# Patient Record
Sex: Male | Born: 1990 | Race: White | Hispanic: No | Marital: Married | Smoking: Never smoker
Health system: Southern US, Community
[De-identification: ages and names within clinical notes are randomized; demographics above are authoritative.]

## PROBLEM LIST (undated history)

## (undated) ENCOUNTER — Ambulatory Visit (HOSPITAL_COMMUNITY): Admission: EM | Discharge status: Absent without leave | Payer: BLUE CROSS/BLUE SHIELD

## (undated) DIAGNOSIS — D682 Hereditary deficiency of other clotting factors: Secondary | ICD-10-CM

## (undated) DIAGNOSIS — Q600 Renal agenesis, unilateral: Secondary | ICD-10-CM

## (undated) DIAGNOSIS — Z86718 Personal history of other venous thrombosis and embolism: Secondary | ICD-10-CM

## (undated) DIAGNOSIS — T7840XA Allergy, unspecified, initial encounter: Secondary | ICD-10-CM

## (undated) DIAGNOSIS — D689 Coagulation defect, unspecified: Secondary | ICD-10-CM

## (undated) HISTORY — DX: Renal agenesis, unilateral: Q60.0

## (undated) HISTORY — DX: Hereditary deficiency of other clotting factors: D68.2

## (undated) HISTORY — PX: LEG SURGERY: SHX1003

## (undated) HISTORY — PX: EYE SURGERY: SHX253

## (undated) HISTORY — DX: Allergy, unspecified, initial encounter: T78.40XA

## (undated) HISTORY — DX: Coagulation defect, unspecified: D68.9

---
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
## 2013-06-19 ENCOUNTER — Encounter (HOSPITAL_COMMUNITY): Payer: Self-pay | Admitting: Emergency Medicine

## 2013-06-19 ENCOUNTER — Emergency Department (HOSPITAL_COMMUNITY)
Admission: EM | Admit: 2013-06-19 | Discharge: 2013-06-19 | Disposition: A | Discharge status: Home / Self Care | Payer: BC Managed Care – PPO | Source: Home / Self Care

## 2013-06-19 DIAGNOSIS — J029 Acute pharyngitis, unspecified: Secondary | ICD-10-CM

## 2013-06-19 LAB — POCT RAPID STREP A: Streptococcus, Group A Screen (Direct): NEGATIVE

## 2013-06-19 NOTE — ED Provider Notes (Signed)
CSN: [id number]     Arrival date & time 06/19/13  7673 History   First MD Initiated Contact with Patient 06/19/13 1012     Chief Complaint  Patient presents with  . Sore Throat   (Consider location/radiation/quality/duration/timing/severity/associated sxs/prior Treatment) HPI Comments: 23 year old male brought into the urgent care by his father he is complaining of swelling of the uvula. He states he can feel it moving back-and-forth typically if he spits out something. He can also feel it when he swallows. Denies actual sore throat. Denies dysphagia. Denies cough problems breathing.   History reviewed. No pertinent past medical history. Past Surgical History  Procedure Laterality Date  . Leg surgery      broken leg   History reviewed. No pertinent family history. History  Substance Use Topics  . Smoking status: Never Smoker   . Smokeless tobacco: Not on file  . Alcohol Use: Yes    Review of Systems  Constitutional: Negative.   HENT: Negative for congestion, rhinorrhea, sinus pressure and sore throat.   Respiratory: Negative.  Negative for cough, choking and shortness of breath.   Gastrointestinal: Negative.     Allergies  Review of patient's allergies indicates no known allergies.  Home Medications   Prior to Admission medications   Not on File   BP 137/61  Pulse 59  Temp(Src) 98.4 F (36.9 C) (Oral)  Resp 12  SpO2 98% Physical Exam  Nursing note and vitals reviewed. Constitutional: He is oriented to person, place, and time. He appears well-developed and well-nourished. No distress.  HENT:  Mouth/Throat: No oropharyngeal exudate.   There is minor redness to the posterior pharynx. The  uvula appears minimally red and this is barely noticeable. There is no edema no apparent enlargement of the uvula, no exudates, no tonsillar tissue visible. Airway is widely patent.  Eyes: Conjunctivae and EOM are normal.  Neck: Normal range of motion. Neck supple.  Cardiovascular:  Normal rate.   Pulmonary/Chest: Effort normal. No respiratory distress.  Lymphadenopathy:    He has no cervical adenopathy.  Neurological: He is alert and oriented to person, place, and time. He exhibits normal muscle tone.  Skin: Skin is warm and dry.    ED Course  Procedures (including critical care time) Labs Review Labs Reviewed - No data to display  Imaging Review No results found.   MDM   1. Pharyngitis     Lots of fluids Ibuprofen as needed. FOr new problems or worsening seek medical attention prn  Janne Napoleon, NP 06/19/13 1047

## 2013-06-19 NOTE — ED Notes (Signed)
C/o sore throat.  Denies ear pain, denies cold

## 2013-06-19 NOTE — Discharge Instructions (Signed)
Pharyngitis °Pharyngitis is a sore throat (pharynx). There is redness, pain, and swelling of your throat. °HOME CARE  °· Drink enough fluids to keep your pee (urine) clear or pale yellow. °· Only take medicine as told by your doctor. °· You may get sick again if you do not take medicine as told. Finish your medicines, even if you start to feel better. °· Do not take aspirin. °· Rest. °· Rinse your mouth (gargle) with salt water (½ tsp of salt per 1 qt of water) every 1 2 hours. This will help the pain. °· If you are not at risk for choking, you can suck on hard candy or sore throat lozenges. °GET HELP IF: °· You have large, tender lumps on your neck. °· You have a rash. °· You cough up green, yellow-brown, or bloody spit. °GET HELP RIGHT AWAY IF:  °· You have a stiff neck. °· You drool or cannot swallow liquids. °· You throw up (vomit) or are not able to keep medicine or liquids down. °· You have very bad pain that does not go away with medicine. °· You have problems breathing (not from a stuffy nose). °MAKE SURE YOU:  °· Understand these instructions. °· Will watch your condition. °· Will get help right away if you are not doing well or get worse. °Document Released: 06/17/2007 Document Revised: 10/19/2012 Document Reviewed: 09/05/2012 °Exit[employer]® Patient Information ©2014 [employer]. ° °

## 2013-06-20 NOTE — ED Provider Notes (Signed)
Medical screening examination/treatment/procedure(s) were performed by a resident physician or non-physician practitioner and as the supervising physician I was immediately available for consultation/collaboration.  Lynne Leader, MD    Gregor Hams, MD 06/20/13 [phone]

## 2013-06-21 LAB — CULTURE, GROUP A STREP

## 2014-01-23 ENCOUNTER — Emergency Department (HOSPITAL_COMMUNITY)
Admission: EM | Admit: 2014-01-23 | Discharge: 2014-01-23 | Disposition: A | Discharge status: Home / Self Care | Payer: BLUE CROSS/BLUE SHIELD | Source: Home / Self Care | Attending: Family Medicine | Admitting: Family Medicine

## 2014-01-23 ENCOUNTER — Encounter (HOSPITAL_COMMUNITY): Payer: Self-pay | Admitting: Emergency Medicine

## 2014-01-23 DIAGNOSIS — J039 Acute tonsillitis, unspecified: Secondary | ICD-10-CM

## 2014-01-23 DIAGNOSIS — J029 Acute pharyngitis, unspecified: Secondary | ICD-10-CM

## 2014-01-23 LAB — POCT RAPID STREP A: STREPTOCOCCUS, GROUP A SCREEN (DIRECT): NEGATIVE

## 2014-01-23 MED ORDER — DEXAMETHASONE 4 MG PO TABS
10.0000 mg | ORAL_TABLET | Freq: Once | ORAL | Status: AC
  Administered 2014-01-23: 10 mg via ORAL

## 2014-01-23 MED ORDER — DEXAMETHASONE 2 MG PO TABS
ORAL_TABLET | ORAL | Status: AC
  Filled 2014-01-23: qty 1

## 2014-01-23 MED ORDER — DEXAMETHASONE 4 MG PO TABS
ORAL_TABLET | ORAL | Status: AC
  Filled 2014-01-23: qty 2

## 2014-01-23 MED ORDER — DEXAMETHASONE 1 MG/ML PO CONC: 10.0000 mg | Freq: Once | ORAL | Status: DC

## 2014-01-23 MED ORDER — AMOXICILLIN 500 MG PO CAPS: 500.0000 mg | ORAL_CAPSULE | Freq: Three times a day (TID) | ORAL | Status: DC

## 2014-01-23 NOTE — ED Notes (Signed)
Pt complains of sore throat x3 days.  Pt denies any other symptoms or fever.

## 2014-01-23 NOTE — Discharge Instructions (Signed)
Your symptoms are likely from strep throat but may only be from a virus You were given steroids to help with the pain and inflammation Please start the antibiotics only if you develop a fever or do not improve in another few days

## 2014-01-23 NOTE — ED Provider Notes (Signed)
CSN: [id number]     Arrival date & time 01/23/14  1128 History   First MD Initiated Contact with Patient 01/23/14 1142     Chief Complaint  Patient presents with  . Sore Throat   (Consider location/radiation/quality/duration/timing/severity/associated sxs/prior Treatment) HPI   Sore throat: started 3 days ago. No change since onset. Has not tried anything to make it better. Painful to swallow. Denies fevers, myalgia, rinorrhea, cough, abd pain, SOB, CP. H/o recurrent tonsillitis. Sharp and burning pain.   History reviewed. No pertinent past medical history. Past Surgical History  Procedure Laterality Date  . Leg surgery      broken leg   Family History  Problem Relation Age of Onset  . Cancer Neg Hx   . Heart failure Neg Hx   . Diabetes Neg Hx   . Hyperlipidemia Neg Hx    History  Substance Use Topics  . Smoking status: Never Smoker   . Smokeless tobacco: Not on file  . Alcohol Use: Yes    Review of Systems Per HPI with all other pertinent systems negative.   Allergies  Review of patient's allergies indicates no known allergies.  Home Medications   Prior to Admission medications   Medication Sig Start Date End Date Taking? Authorizing Provider  amoxicillin (AMOXIL) 500 MG capsule Take 1 capsule (500 mg total) by mouth 3 (three) times daily. 01/23/14   Waldemar Dickens, MD   BP 115/69 mmHg  Pulse 64  Temp(Src) 98 F (36.7 C) (Oral)  Resp 16  SpO2 97% Physical Exam  Constitutional: He is oriented to person, place, and time. He appears well-developed and well-nourished.  HENT:  Pharyngeal injection and tonsils 0-1+ w/ exudate Hoarse voice Nasal passages and sinuses nml  Eyes: EOM are normal. Pupils are equal, round, and reactive to light.  Neck: Normal range of motion.  Cardiovascular: Normal rate, normal heart sounds and intact distal pulses.   No murmur heard. Pulmonary/Chest: Effort normal and breath sounds normal. No respiratory distress. He has no wheezes.  He has no rales.  Abdominal: Soft. Bowel sounds are normal.  Musculoskeletal: Normal range of motion. He exhibits no edema or tenderness.  Neurological: He is alert and oriented to person, place, and time.  Skin: Skin is warm. No rash noted.  Psychiatric: He has a normal mood and affect. His behavior is normal. Judgment and thought content normal.    ED Course  Procedures (including critical care time) Labs Review Labs Reviewed  POCT RAPID STREP A ([place])    Imaging Review No results found.   MDM   1. Pharyngitis   2. Tonsillitis   Rapid strep negative but pt symptoms concerning for strep. Decadron 10mg  PO in office AMoxicillin 500 BID x 10 days. Pt to wait 24 hrs prior to starting ABX to see if symtpoms improve (unless develops fevers) Precautions given and all questions answered  Linna Darner, MD Family Medicine 01/23/2014, 12:11 PM      Waldemar Dickens, MD 01/23/14 [phone]

## 2014-01-25 LAB — CULTURE, GROUP A STREP

## 2016-11-16 ENCOUNTER — Ambulatory Visit (HOSPITAL_COMMUNITY)
Admission: EM | Admit: 2016-11-16 | Discharge: 2016-11-16 | Disposition: A | Discharge status: Home / Self Care | Payer: BLUE CROSS/BLUE SHIELD | Attending: Family Medicine | Admitting: Family Medicine

## 2016-11-16 ENCOUNTER — Encounter (HOSPITAL_COMMUNITY): Payer: Self-pay | Admitting: Emergency Medicine

## 2016-11-16 DIAGNOSIS — S46912A Strain of unspecified muscle, fascia and tendon at shoulder and upper arm level, left arm, initial encounter: Secondary | ICD-10-CM | POA: Diagnosis not present

## 2016-11-16 MED ORDER — DICLOFENAC SODIUM 75 MG PO TBEC: 75.0000 mg | DELAYED_RELEASE_TABLET | Freq: Two times a day (BID) | ORAL | 0 refills | Status: DC

## 2016-11-16 NOTE — ED Provider Notes (Signed)
  [place]   [id number] 11/16/16 Arrival Time: 2297  ASSESSMENT & PLAN:  1. Strain of left shoulder, initial encounter     Meds ordered this encounter  Medications  . diclofenac (VOLTAREN) 75 MG EC tablet    Sig: Take 1 tablet (75 mg total) 2 (two) times daily by mouth.    Dispense:  14 tablet    Refill:  0   Work note given. Will f/u in one week if not showing significant improvement, sooner if needed. Reviewed expectations re: course of current medical issues. Questions answered. Outlined signs and symptoms indicating need for more acute intervention. Patient verbalized understanding. After Visit Summary given.   SUBJECTIVE:  Daniel Knox is a 26 y.o. male who reports pain of his left anterior shoulder/neck for the past 3-4 weeks. No specific injury or trauma. 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 OTC ibuprofen with mild and temporary relief. No extremity sensation changes or weakness. Discomfort exacerbated with certain movements of his left arm. No swelling or bruising reported. Does not limit him. Some stiffness reported. No h/o similar reported.  ROS: As per HPI.   OBJECTIVE:  Vitals:   11/16/16 1029  BP: 126/70  Pulse: (!) 50  Resp: 18  Temp: 98.5 F (36.9 C)  TempSrc: Oral  SpO2: 100%    General appearance: alert; no distress Extremities: no cyanosis or edema; symmetrical with no gross deformities; describes anterior left shoulder/neck discomfort with bringing left arm across chest; does have FROM of left arm; no bony tenderness Neck: FROM; suppler; no midline tenderness CV: normal extremity capillary refill Skin: warm and dry Neurologic: normal gait; normal symmetric reflexes in all extremities; normal sensation Psychological: alert and cooperative; normal mood and affect   No Known Allergies   Social History   Socioeconomic History  . Marital status: Single    Spouse name: Not on file  . Number of children:  Not on file  . Years of education: Not on file  . Highest education level: Not on file  Social Needs  . Financial resource strain: Not on file  . Food insecurity - worry: Not on file  . Food insecurity - inability: Not on file  . Transportation needs - medical: Not on file  . Transportation needs - non-medical: Not on file  Occupational History  . Not on file  Tobacco Use  . Smoking status: Never Smoker  Substance and Sexual Activity  . Alcohol use: Yes  . Drug use: No  . Sexual activity: Not on file  Other Topics Concern  . Not on file  Social History Narrative  . Not on file   Family History  Problem Relation Age of Onset  . Cancer Neg Hx   . Heart failure Neg Hx   . Diabetes Neg Hx   . Hyperlipidemia Neg Hx    Past Surgical History:  Procedure Laterality Date  . LEG SURGERY     broken leg     Vanessa Kick, MD 11/16/16 1054

## 2016-11-16 NOTE — ED Triage Notes (Signed)
Pt c/o L shoulder pain x1 month,d enies injuyr. Painful ROM.

## 2018-07-30 DIAGNOSIS — S81012A Laceration without foreign body, left knee, initial encounter: Secondary | ICD-10-CM | POA: Diagnosis not present

## 2018-07-30 DIAGNOSIS — M25562 Pain in left knee: Secondary | ICD-10-CM | POA: Diagnosis not present

## 2018-07-30 DIAGNOSIS — W293XXA Contact with powered garden and outdoor hand tools and machinery, initial encounter: Secondary | ICD-10-CM | POA: Diagnosis not present

## 2018-07-30 DIAGNOSIS — Y929 Unspecified place or not applicable: Secondary | ICD-10-CM | POA: Diagnosis not present

## 2018-07-30 DIAGNOSIS — Y93H9 Activity, other involving exterior property and land maintenance, building and construction: Secondary | ICD-10-CM | POA: Diagnosis not present

## 2018-07-30 DIAGNOSIS — W268XXA Contact with other sharp object(s), not elsewhere classified, initial encounter: Secondary | ICD-10-CM | POA: Diagnosis not present

## 2018-09-01 ENCOUNTER — Other Ambulatory Visit: Payer: Self-pay

## 2018-09-01 ENCOUNTER — Telehealth: Payer: BLUE CROSS/BLUE SHIELD | Admitting: Physician Assistant

## 2018-09-01 DIAGNOSIS — Z20822 Contact with and (suspected) exposure to covid-19: Secondary | ICD-10-CM

## 2018-09-01 DIAGNOSIS — J069 Acute upper respiratory infection, unspecified: Secondary | ICD-10-CM

## 2018-09-01 NOTE — Progress Notes (Signed)
E-Visit for Corona Virus Screening   Your current symptoms could be consistent with the coronavirus.  Many health care providers can now test patients at their office but not all are.  [facility] has multiple testing sites. For information on our COVID testing locations and hours go to [email or web address]  Please quarantine yourself while awaiting your test results.  We are enrolling you in our [employer] for [place] . Daily you will receive a questionnaire within the [facility] website. Our COVID 19 response team willl be monitoriing your responses daily.    COVID-19 is a respiratory illness with symptoms that are similar to the flu. Symptoms are typically mild to moderate, but there have been cases of severe illness and death due to the virus. The following symptoms may appear 2-14 days after exposure: . Fever . Cough . Shortness of breath or difficulty breathing . Chills . Repeated shaking with chills . Muscle pain . Headache . Sore throat . New loss of taste or smell . Fatigue . Congestion or runny nose . Nausea or vomiting . Diarrhea  It is vitally important that if you feel that you have an infection such as this virus or any other virus that you stay home and away from places where you may spread it to others.  You should self-quarantine for 14 days if you have symptoms that could potentially be coronavirus or have been in close contact a with a person diagnosed with COVID-19 within the last 2 weeks. You should avoid contact with people age 60 and older.   You should wear a mask or cloth face covering over your nose and mouth if you must be around other people or animals, including pets (even at home). Try to stay at least 6 feet away from other people. This will protect the people around you.   You may also take acetaminophen (Tylenol) as needed for fever.   Reduce your risk of any infection by using the same precautions used for avoiding  the common cold or flu:  Marland Kitchen Wash your hands often with soap and warm water for at least 20 seconds.  If soap and water are not readily available, use an alcohol-based hand sanitizer with at least 60% alcohol.  . If coughing or sneezing, cover your mouth and nose by coughing or sneezing into the elbow areas of your shirt or coat, into a tissue or into your sleeve (not your hands). . Avoid shaking hands with others and consider head nods or verbal greetings only. . Avoid touching your eyes, nose, or mouth with unwashed hands.  . Avoid close contact with people who are sick. . Avoid places or events with large numbers of people in one location, like concerts or sporting events. . Carefully consider travel plans you have or are making. . If you are planning any travel outside or inside the [place], visit the CDC's Travelers' Health webpage for the latest health notices. . If you have some symptoms but not all symptoms, continue to monitor at home and seek medical attention if your symptoms worsen. . If you are having a medical emergency, call 911.  HOME CARE . Only take medications as instructed by your medical team. . Drink plenty of fluids and get plenty of rest. . A steam or ultrasonic humidifier can help if you have congestion.   GET HELP RIGHT AWAY IF YOU HAVE EMERGENCY WARNING SIGNS** FOR COVID-19. If you or someone is showing any of these signs seek emergency medical care immediately.  Call 911 or proceed to your closest emergency facility if: . You develop worsening high fever. . Trouble breathing . Bluish lips or face . Persistent pain or pressure in the chest . New confusion . Inability to wake or stay awake . You cough up blood. . Your symptoms become more severe  **This list is not all possible symptoms. Contact your medical provider for any symptoms that are sever or concerning to you.   MAKE SURE YOU   Understand these instructions.  Will watch your condition.  Will get help  right away if you are not doing well or get worse.  Your e-visit answers were reviewed by a board certified advanced clinical practitioner to complete your personal care plan.  Depending on the condition, your plan could have included both over the counter or prescription medications.  If there is a problem please reply once you have received a response from your provider.  Your safety is important to [place].  If you have drug allergies check your prescription carefully.    You can use MyChart to ask questions about today's visit, request a non-urgent call back, or ask for a work or school excuse for 24 hours related to this e-Visit. If it has been greater than 24 hours you will need to follow up with your provider, or enter a new e-Visit to address those concerns. You will get an e-mail in the next two days asking about your experience.  I hope that your e-visit has been valuable and will speed your recovery. Thank you for using e-visits.

## 2018-09-01 NOTE — Progress Notes (Signed)
I have spent 5 minutes in review of e-visit questionnaire, review and updating patient chart, medical decision making and response to patient.   Ryler Laskowski Cody Kelilah Hebard, PA-C    

## 2018-09-02 LAB — NOVEL CORONAVIRUS, NAA: SARS-CoV-2, NAA: NOT DETECTED

## 2019-05-22 ENCOUNTER — Ambulatory Visit (HOSPITAL_COMMUNITY)
Admission: EM | Admit: 2019-05-22 | Discharge: 2019-05-22 | Disposition: A | Discharge status: Home / Self Care | Payer: BC Managed Care – PPO | Attending: Emergency Medicine | Admitting: Emergency Medicine

## 2019-05-22 ENCOUNTER — Encounter: Payer: Self-pay | Admitting: Nurse Practitioner

## 2019-05-22 ENCOUNTER — Other Ambulatory Visit: Payer: Self-pay

## 2019-05-22 ENCOUNTER — Encounter (HOSPITAL_COMMUNITY): Payer: Self-pay

## 2019-05-22 DIAGNOSIS — R1031 Right lower quadrant pain: Secondary | ICD-10-CM

## 2019-05-22 LAB — CBC WITH DIFFERENTIAL/PLATELET
Abs Immature Granulocytes: 0.02 10*3/uL (ref 0.00–0.07)
Basophils Absolute: 0 10*3/uL (ref 0.0–0.1)
Basophils Relative: 0 %
Eosinophils Absolute: 0.1 10*3/uL (ref 0.0–0.5)
Eosinophils Relative: 1 %
HCT: 46.2 % (ref 39.0–52.0)
Hemoglobin: 15.3 g/dL (ref 13.0–17.0)
Immature Granulocytes: 0 %
Lymphocytes Relative: 27 %
Lymphs Abs: 1.5 10*3/uL (ref 0.7–4.0)
MCH: 30.2 pg (ref 26.0–34.0)
MCHC: 33.1 g/dL (ref 30.0–36.0)
MCV: 91.3 fL (ref 80.0–100.0)
Monocytes Absolute: 0.3 10*3/uL (ref 0.1–1.0)
Monocytes Relative: 6 %
Neutro Abs: 3.8 10*3/uL (ref 1.7–7.7)
Neutrophils Relative %: 66 %
Platelets: 257 10*3/uL (ref 150–400)
RBC: 5.06 MIL/uL (ref 4.22–5.81)
RDW: 11.7 % (ref 11.5–15.5)
WBC: 5.8 10*3/uL (ref 4.0–10.5)
nRBC: 0 % (ref 0.0–0.2)

## 2019-05-22 LAB — POCT URINALYSIS DIP (DEVICE)
Bilirubin Urine: NEGATIVE
Glucose, UA: NEGATIVE mg/dL
Hgb urine dipstick: NEGATIVE
Leukocytes,Ua: NEGATIVE
Nitrite: NEGATIVE
Protein, ur: NEGATIVE mg/dL
Specific Gravity, Urine: 1.01 (ref 1.005–1.030)
Urobilinogen, UA: 0.2 mg/dL (ref 0.0–1.0)
pH: 6 (ref 5.0–8.0)

## 2019-05-22 LAB — COMPREHENSIVE METABOLIC PANEL
ALT: 18 U/L (ref 0–44)
AST: 20 U/L (ref 15–41)
Albumin: 5 g/dL (ref 3.5–5.0)
Alkaline Phosphatase: 42 U/L (ref 38–126)
Anion gap: 11 (ref 5–15)
BUN: 11 mg/dL (ref 6–20)
CO2: 26 mmol/L (ref 22–32)
Calcium: 9.8 mg/dL (ref 8.9–10.3)
Chloride: 103 mmol/L (ref 98–111)
Creatinine, Ser: 1.07 mg/dL (ref 0.61–1.24)
GFR calc Af Amer: >60 mL/min (ref >60)
GFR calc non Af Amer: >60 mL/min (ref >60)
Glucose, Bld: 95 mg/dL (ref 70–99)
Potassium: 4.2 mmol/L (ref 3.5–5.1)
Sodium: 140 mmol/L (ref 135–145)
Total Bilirubin: 1.4 mg/dL — ABNORMAL HIGH (ref 0.3–1.2)
Total Protein: 7.7 g/dL (ref 6.5–8.1)

## 2019-05-22 LAB — LIPASE, BLOOD: Lipase: 23 U/L (ref 11–51)

## 2019-05-22 MED ORDER — DICYCLOMINE HCL 20 MG PO TABS: 20.0000 mg | ORAL_TABLET | Freq: Two times a day (BID) | ORAL | 0 refills | Status: DC

## 2019-05-22 MED ORDER — ONDANSETRON 4 MG PO TBDP: 4.0000 mg | ORAL_TABLET | Freq: Three times a day (TID) | ORAL | 0 refills | Status: DC | PRN

## 2019-05-22 NOTE — Discharge Instructions (Signed)
Urine normal, no blood suggestive of kidney stone Blood work pending May use zofran as needed for nausea Bentyl as needed for pain/cramping when it flares up If pain returning/worsening please follow up in emergency room for further evaluation of possible appendicitis

## 2019-05-22 NOTE — ED Provider Notes (Signed)
[name]    CSN: [id number] Arrival date & time: 05/22/19  0801      History   Chief Complaint Chief Complaint  Patient presents with  . Flank Pain    HPI Daniel Knox is a 29 y.o. male no significant past medical history presenting today for evaluation of abdominal pain.  Patient notes that he has had abdominal pain for the past 10 days.  Initially was in the middle of his abdomen, but is new to his right lower quadrant.  He is concerned about appendicitis as he notes that his father had acute appendicitis and his mother had chronic appendicitis.  He has had some nausea, but denies vomiting.  Reports urge to use the restroom, occasionally will have diarrhea other times mainly just cramping sensation.  Denies blood in stool.  Has maintained appetite and has been eating and drinking without worsening of pain.  Denies radiation into groin or to back.  Denies urinary symptoms of dysuria, increased frequency urgency or hematuria.  Denies history of kidney stones.  Denies radiation into the testicle or testicle pain/swelling.  Denies chest pain or shortness of breath.  Denies URI symptoms. No prior abdominal surgeries.  Pain waxes and wanes, pain is minimal at time of visit.  HPI  History reviewed. No pertinent past medical history.  There are no problems to display for this patient.   Past Surgical History:  Procedure Laterality Date  . LEG SURGERY     broken leg       Home Medications    Prior to Admission medications   Medication Sig Start Date End Date Taking? Authorizing Provider  dicyclomine (BENTYL) 20 MG tablet Take 1 tablet (20 mg total) by mouth 2 (two) times daily. 05/22/19   Adriel Kessen C, PA-C  ondansetron (ZOFRAN ODT) 4 MG disintegrating tablet Take 1 tablet (4 mg total) by mouth every 8 (eight) hours as needed for nausea or vomiting. 05/22/19   Deolinda Frid, Elesa Hacker, PA-C    Family History Family History  Problem Relation Age of Onset  . Healthy  Mother   . Factor IX deficiency Father   . Cancer Neg Hx   . Heart failure Neg Hx   . Diabetes Neg Hx   . Hyperlipidemia Neg Hx     Social History Social History   Tobacco Use  . Smoking status: Never Smoker  . Smokeless tobacco: Never Used  Substance Use Topics  . Alcohol use: Yes  . Drug use: No     Allergies   Patient has no known allergies.   Review of Systems Review of Systems  Constitutional: Negative for activity change, appetite change, chills, fatigue and fever.  HENT: Negative for congestion, ear pain, rhinorrhea, sinus pressure, sore throat and trouble swallowing.   Eyes: Negative for discharge and redness.  Respiratory: Negative for cough, chest tightness and shortness of breath.   Cardiovascular: Negative for chest pain.  Gastrointestinal: Positive for abdominal pain, diarrhea and nausea. Negative for vomiting.  Genitourinary: Negative for difficulty urinating, discharge, dysuria, frequency, hematuria, penile pain, scrotal swelling and testicular pain.  Musculoskeletal: Negative for back pain and myalgias.  Skin: Negative for rash.  Neurological: Negative for dizziness, light-headedness and headaches.     Physical Exam Triage Vital Signs ED Triage Vitals  Enc Vitals Group     BP 05/22/19 0818 124/85     Pulse Rate 05/22/19 0818 61     Resp 05/22/19 0818 18     Temp 05/22/19 0818  98.3 F (36.8 C)     Temp Source 05/22/19 0818 Oral     SpO2 05/22/19 0818 98 %     Weight 05/22/19 0822 166 lb 9.6 oz (75.6 kg)     Height --      Head Circumference --      Peak Flow --      Pain Score 05/22/19 0821 6     Pain Loc --      Pain Edu? --      Excl. in [place]? --    No data found.  Updated Vital Signs BP 124/85 (BP Location: Right Arm)   Pulse 61   Temp 98.3 F (36.8 C) (Oral)   Resp 18   Wt 166 lb 9.6 oz (75.6 kg)   SpO2 98%   Visual Acuity Right Eye Distance:   Left Eye Distance:   Bilateral Distance:    Right Eye Near:   Left Eye Near:      Bilateral Near:     Physical Exam Vitals and nursing note reviewed.  Constitutional:      Appearance: He is well-developed.  HENT:     Head: Normocephalic and atraumatic.  Eyes:     Conjunctiva/sclera: Conjunctivae normal.  Cardiovascular:     Rate and Rhythm: Normal rate and regular rhythm.     Heart sounds: No murmur.  Pulmonary:     Effort: Pulmonary effort is normal. No respiratory distress.     Breath sounds: Normal breath sounds.     Comments: Breathing comfortably at rest, CTABL, no wheezing, rales or other adventitious sounds auscultated Abdominal:     Palpations: Abdomen is soft.     Tenderness: There is no abdominal tenderness.     Comments: Soft, nondistended, nontender to palpation throughout entire abdomen.  Musculoskeletal:     Cervical back: Neck supple.  Skin:    General: Skin is warm and dry.  Neurological:     Mental Status: He is alert.      UC Treatments / Results  Labs (all labs ordered are listed, but only abnormal results are displayed) Labs Reviewed  POCT URINALYSIS DIP (DEVICE) - Abnormal; Notable for the following components:      Result Value   Ketones, ur TRACE (*)    All other components within normal limits  CBC WITH DIFFERENTIAL/PLATELET  COMPREHENSIVE METABOLIC PANEL  LIPASE, BLOOD    EKG   Radiology No results found.  Procedures Procedures (including critical care time)  Medications Ordered in UC Medications - No data to display  Initial Impression / Assessment and Plan / UC Course  I have reviewed the triage vital signs and the nursing notes.  Pertinent labs & imaging results that were available during my care of the patient were reviewed by me and considered in my medical decision making (see chart for details).     UA with negative hemoglobin, negative leuks and nitrites.  Do not suspect GU cause of pain at this time.  Discussed with patient given his current concern for possible appendicitis that we did not have  imaging available to definitively rule this out.  He likely would need ultrasound or CT to evaluate for appendicitis.  Feel timeline less likely for acute appendicitis.  Checking blood work of CBC, CMP and lipase, will call with results and alter plan as needed.  Providing Zofran and Bentyl to use for symptomatic and supportive care, discussed establishing care with PCP or following up with gastroenterology if symptoms persisting for further work-up  of persistent pain.  Discussed strict return precautions. Patient verbalized understanding and is agreeable with plan.  Final Clinical Impressions(s) / UC Diagnoses   Final diagnoses:  Right lower quadrant abdominal pain     Discharge Instructions     Urine normal, no blood suggestive of kidney stone Blood work pending May use zofran as needed for nausea Bentyl as needed for pain/cramping when it flares up If pain returning/worsening please follow up in emergency room for further evaluation of possible appendicitis    ED Prescriptions    Medication Sig Dispense Auth. Provider   dicyclomine (BENTYL) 20 MG tablet Take 1 tablet (20 mg total) by mouth 2 (two) times daily. 20 tablet Dennard Vezina C, PA-C   ondansetron (ZOFRAN ODT) 4 MG disintegrating tablet Take 1 tablet (4 mg total) by mouth every 8 (eight) hours as needed for nausea or vomiting. 20 tablet Tanishka Drolet, [place] C, PA-C     PDMP not reviewed this encounter.   Janith Lima, [place] 05/22/19 [phone]

## 2019-05-22 NOTE — ED Triage Notes (Signed)
Pt is here with right flank pain that started 10 days ago. Pt has taken Pepto to relieve discomfort.

## 2019-05-30 ENCOUNTER — Encounter: Payer: Self-pay | Admitting: Gastroenterology

## 2019-05-30 ENCOUNTER — Other Ambulatory Visit (INDEPENDENT_AMBULATORY_CARE_PROVIDER_SITE_OTHER): Payer: BC Managed Care – PPO

## 2019-05-30 ENCOUNTER — Ambulatory Visit: Payer: BC Managed Care – PPO | Admitting: Gastroenterology

## 2019-05-30 VITALS — BP 100/62 | HR 52 | Ht 70.0 in | Wt 161.2 lb

## 2019-05-30 DIAGNOSIS — R109 Unspecified abdominal pain: Secondary | ICD-10-CM | POA: Diagnosis not present

## 2019-05-30 LAB — SEDIMENTATION RATE: Sed Rate: 1 mm/hr (ref 0–15)

## 2019-05-30 LAB — C-REACTIVE PROTEIN: CRP: <1 mg/dL (ref 0.5–20.0)

## 2019-05-30 NOTE — Patient Instructions (Addendum)
Please take a daily stool bulking agent such as Metamucil, Benefiber, or Citrucel.  I have recommended some labs and stool studies to look for infection and inflammation. You have been scheduled for a CT scan of the abdomen and pelvis at [facility] ([address]---this is in the same building as Press [facility]).   You are scheduled on 06/02/2019 at 4:15. You should arrive 15 minutes prior to your appointment time for registration. Please follow the written instructions below on the day of your exam:  WARNING: IF YOU ARE ALLERGIC TO IODINE/X-RAY DYE, PLEASE NOTIFY RADIOLOGY IMMEDIATELY AT [phone]! YOU WILL BE GIVEN A 13 HOUR PREMEDICATION PREP.  1) Do not eat or drink anything after 12:00pm (4 hours prior to your test) 2) You have been given 2 bottles of oral contrast to drink. The solution may taste better if refrigerated, but do NOT add ice or any other liquid to this solution. Shake well before drinking.    Drink 1 bottle of contrast @ 2:15 pm (2 hours prior to your exam)  Drink 1 bottle of contrast @ 3:15 am (1 hour prior to your exam)  You may take any medications as prescribed with a small amount of water, if necessary. If you take any of the following medications: METFORMIN, GLUCOPHAGE, GLUCOVANCE, AVANDAMET, RIOMET, FORTAMET, [place] MET, JANUMET, GLUMETZA or METAGLIP, you MAY be asked to HOLD this medication 48 hours AFTER the exam.  The purpose of you drinking the oral contrast is to aid in the visualization of your intestinal tract. The contrast solution may cause some diarrhea. Depending on your individual set of symptoms, you may also receive an intravenous injection of x-ray contrast/dye. Plan on being at [facility] for 30 minutes or longer, depending on the type of exam you are having performed.  This test typically takes 30-45 minutes to complete.  If you have any questions regarding your exam or if you need to reschedule, you may call the CT  department at [phone] between the hours of 8:00 am and 5:00 pm, Monday-Friday.  ________________________________________________________________________   I will let you know when we have the results from your labs and CT scan.   Thank you for putting your trust in me. Please call with any questions or concerns before your results are available.   Your provider has requested that you go to the basement level for lab work before leaving today. Press "B" on the elevator. The lab is located at the first door on the left as you exit the elevator.   Due to recent changes in healthcare laws, you may see the results of your imaging and laboratory studies on MyChart before your provider has had a chance to review them.  We understand that in some cases there may be results that are confusing or concerning to you. Not all laboratory results come back in the same time frame and the provider may be waiting for multiple results in order to interpret others.  Please give [place] 48 hours in order for your provider to thoroughly review all the results before contacting the office for clarification of your results.

## 2019-05-30 NOTE — Progress Notes (Signed)
Referring Provider: Janith Lima, PA-C Primary Care Physician:  Patient, No Pcp Per  Reason for Consultation:  Abdominal pain   IMPRESSION:  Abdominal pain/pressure with urgency Factor V Leiden deficiency  PLAN: Daily stool bulking agent with psyllium CT abd/pelvis with contrast to further evaluate his abdominal pain Continue Bentyl as needed Fecal calprotectin, ESR, CRP to screen for IBD GI pathogen panel to evaluate infectious etiologies EGD and colonoscopy if cross-sectional imaging is negative and symptoms persist  Please see the "Patient Instructions" section for addition details about the plan.  HPI: Daniel Knox is a 29 y.o. male who is self-referred for further evaluation of abdominal pain.  The history is obtained through the patient and review of his electronic health record. He has Factor V Leiden.  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   Developed , non-radiating, intermittent abdominal pain last month described as a pressure.  Initially started in the mid abdomen but is now more predominantly in the right lower quadrant or left lower quadrant.  Was initially feeling constipated as well. Took Miralax OTC x 1.  Further constipation but his abdominal pressure persists. Would feel abdominal pressure with associated urgency to defecate, although sometimes he wouldn't have a bowel movement.  Some nausea.  No blood or mucous.  No change with movement or eating. Weight has decreased. Appetite is good. However, he avoids eating if he is concerned about having trouble.   Felt normal yesterday. Today, he describes a return of the abdominal pressure. Appetite is good.  Weight is stable.  He was concerned for appendicitis and sought care in the Urgent Care. 05/22/19: normal CMP except for bilirubin of 1.4, nomral CBC, UA showed trace ketones. No imaging performed at that time.  He was told to follow-up with his primary care provider or gastroenterology.   Was given prescriptions for both Zofran and Bentyl.  Both of his parents have had symptomatic appendicitis.  No known family history of colon cancer or polyps. No family history of uterine/endometrial cancer, pancreatic cancer or gastric/stomach cancer.   Past Medical History:  Diagnosis Date  . Allergies   . Factor V deficiency ([place])   . H/O blood clots     Past Surgical History:  Procedure Laterality Date  . LEG SURGERY     broken leg    Current Outpatient Medications  Medication Sig Dispense Refill  . dicyclomine (BENTYL) 20 MG tablet Take 1 tablet (20 mg total) by mouth 2 (two) times daily. (Patient not taking: Reported on 05/30/2019) 20 tablet 0  . ondansetron (ZOFRAN ODT) 4 MG disintegrating tablet Take 1 tablet (4 mg total) by mouth every 8 (eight) hours as needed for nausea or vomiting. (Patient not taking: Reported on 05/30/2019) 20 tablet 0   No current facility-administered medications for this visit.    Allergies as of 05/30/2019  . (No Known Allergies)    Family History  Problem Relation Age of Onset  . Healthy Mother   . Factor IX deficiency Father   . Cancer Neg Hx   . Heart failure Neg Hx   . Diabetes Neg Hx   . Hyperlipidemia Neg Hx     Social History   Socioeconomic History  . Marital status: Married    Spouse name: Not on file  . Number of children: Not on file  . Years of education: Not on file  . Highest education level: Not on file  Occupational History  . Occupation: cdl  driver   Tobacco Use  . Smoking status: Never Smoker  . Smokeless tobacco: Never Used  Substance and Sexual Activity  . Alcohol use: Yes  . Drug use: No  . Sexual activity: Yes    Birth control/protection: None    Comment: Married  Other Topics Concern  . Not on file  Social History Narrative  . Not on file   Social Determinants of Health   Financial Resource Strain:   . Difficulty of Paying Living Expenses:   Food Insecurity:   . Worried About Sales executive in the Last Year:   . Arboriculturist in the Last Year:   Transportation Needs:   . Film/video editor (Medical):   Marland Kitchen Lack of Transportation (Non-Medical):   Physical Activity:   . Days of Exercise per Week:   . Minutes of Exercise per Session:   Stress:   . Feeling of Stress :   Social Connections:   . Frequency of Communication with Friends and Family:   . Frequency of Social Gatherings with Friends and Family:   . Attends Religious Services:   . Active Member of Clubs or Organizations:   . Attends Archivist Meetings:   Marland Kitchen Marital Status:   Intimate Partner Violence:   . Fear of Current or Ex-Partner:   . Emotionally Abused:   Marland Kitchen Physically Abused:   . Sexually Abused:     Review of Systems: 12 system ROS is negative except as noted above except for allergies.   Physical Exam: General:   Alert,  well-nourished, pleasant and cooperative in NAD Head:  Normocephalic and atraumatic. Eyes:  Sclera clear, no icterus.   Conjunctiva pink. Ears:  Normal auditory acuity. Nose:  No deformity, discharge,  or lesions. Mouth:  No deformity or lesions.   Neck:  Supple; no masses or thyromegaly. Lungs:  Clear throughout to auscultation.   No wheezes. Heart:  Regular rate and rhythm; no murmurs. Abdomen:  Soft, nontender -I am unable to reproduce his pain, nondistended, normal bowel sounds, no rebound or guarding. No hepatosplenomegaly.   Rectal:  Deferred  Msk:  Symmetrical. No boney deformities LAD: No inguinal or umbilical LAD Extremities:  No clubbing or edema. Neurologic:  Alert and  oriented x4;  grossly nonfocal Skin:  Intact without significant lesions or rashes. Psych:  Alert and cooperative. Normal mood and affect.     Jauna Raczynski L. Tarri Glenn, MD, MPH 05/30/2019, 5:21 PM

## 2019-06-02 ENCOUNTER — Ambulatory Visit (INDEPENDENT_AMBULATORY_CARE_PROVIDER_SITE_OTHER)
Admission: RE | Admit: 2019-06-02 | Discharge: 2019-06-02 | Disposition: A | Discharge status: Home / Self Care | Payer: BC Managed Care – PPO | Source: Ambulatory Visit | Attending: Gastroenterology | Admitting: Gastroenterology

## 2019-06-02 ENCOUNTER — Other Ambulatory Visit: Payer: Self-pay

## 2019-06-02 DIAGNOSIS — R109 Unspecified abdominal pain: Secondary | ICD-10-CM | POA: Diagnosis not present

## 2019-06-02 MED ORDER — IOHEXOL 300 MG/ML  SOLN
100.0000 mL | Freq: Once | INTRAMUSCULAR | Status: AC | PRN
  Administered 2019-06-02: 100 mL via INTRAVENOUS

## 2019-06-07 ENCOUNTER — Telehealth: Payer: Self-pay | Admitting: Gastroenterology

## 2019-06-07 ENCOUNTER — Ambulatory Visit: Payer: BC Managed Care – PPO | Admitting: Nurse Practitioner

## 2019-06-07 ENCOUNTER — Other Ambulatory Visit: Payer: BC Managed Care – PPO

## 2019-06-07 DIAGNOSIS — R109 Unspecified abdominal pain: Secondary | ICD-10-CM

## 2019-06-07 NOTE — Telephone Encounter (Signed)
Spoke with patient regarding Urology referral, referral placed and notes faxed. Patient aware of appointment with Dr. Gloriann Loan at [facility] on 07/13/2019 at 9:15 am, first available appointment, pt put on cancellation list for a sooner appointment if possible.

## 2019-06-13 NOTE — Telephone Encounter (Signed)
Also concerned about the stool test that was incomplete

## 2019-06-13 NOTE — Telephone Encounter (Signed)
Patient would like a referral to [facility] instead for they can see him sooner.

## 2019-06-14 ENCOUNTER — Other Ambulatory Visit: Payer: Self-pay

## 2019-06-14 ENCOUNTER — Telehealth: Payer: Self-pay

## 2019-06-14 DIAGNOSIS — R1030 Lower abdominal pain, unspecified: Secondary | ICD-10-CM

## 2019-06-14 DIAGNOSIS — N3289 Other specified disorders of bladder: Secondary | ICD-10-CM

## 2019-06-14 DIAGNOSIS — N329 Bladder disorder, unspecified: Secondary | ICD-10-CM

## 2019-06-14 LAB — GI PROFILE, STOOL, PCR

## 2019-06-14 LAB — CALPROTECTIN, FECAL: Calprotectin, Fecal: 30 ug/g (ref 0–120)

## 2019-06-14 NOTE — Telephone Encounter (Signed)
Per [facility] they have epic and like the referrals to be entered in epic. Referral entered and message left for Dan Europe that referral is in epic and pt needs asap appt.

## 2019-06-14 NOTE — Telephone Encounter (Signed)
Spoke with patient regarding request to have notes, imaging, etc sent to [facility], will fax referral today for first available appointment to [phone]. Pt also wanted to know further steps since GI profile stool was cancelled by the lab. Pt informed that [name] is awaiting results of Fecal calprotectin before deciding on next steps and wether or not she will want pt to repeat cancelled test. Pt advised to call with any other questions or concerns.

## 2019-06-15 ENCOUNTER — Telehealth: Payer: Self-pay | Admitting: Gastroenterology

## 2019-06-15 NOTE — Telephone Encounter (Signed)
Pt states he has been scheduled to see Urology in [facility] 6/10 and wanted to cancel the appt with [facility]. Pt given the phone number to call and cancel the appt at [facility].

## 2019-06-22 ENCOUNTER — Ambulatory Visit: Payer: BC Managed Care – PPO | Admitting: Urology

## 2019-06-22 ENCOUNTER — Encounter: Payer: Self-pay | Admitting: Urology

## 2019-06-22 ENCOUNTER — Telehealth: Payer: Self-pay | Admitting: Gastroenterology

## 2019-06-22 ENCOUNTER — Other Ambulatory Visit: Payer: Self-pay

## 2019-06-22 VITALS — BP 125/73 | HR 66 | Ht 70.0 in | Wt 165.3 lb

## 2019-06-22 DIAGNOSIS — N3289 Other specified disorders of bladder: Secondary | ICD-10-CM

## 2019-06-22 DIAGNOSIS — IMO0002 Reserved for concepts with insufficient information to code with codable children: Secondary | ICD-10-CM

## 2019-06-22 DIAGNOSIS — Q600 Renal agenesis, unilateral: Secondary | ICD-10-CM | POA: Diagnosis not present

## 2019-06-22 LAB — URINALYSIS, COMPLETE
Bilirubin, UA: NEGATIVE
Glucose, UA: NEGATIVE
Ketones, UA: NEGATIVE
Leukocytes,UA: NEGATIVE
Nitrite, UA: NEGATIVE
Protein,UA: NEGATIVE
RBC, UA: NEGATIVE
Specific Gravity, UA: <1.005 — ABNORMAL LOW (ref 1.005–1.030)
Urobilinogen, Ur: 0.2 mg/dL (ref 0.2–1.0)
pH, UA: 6 (ref 5.0–7.5)

## 2019-06-22 LAB — MICROSCOPIC EXAMINATION
Bacteria, UA: NONE SEEN
Epithelial Cells (non renal): NONE SEEN /hpf (ref 0–10)
RBC, Urine: NONE SEEN /hpf (ref 0–2)
WBC, UA: NONE SEEN /hpf (ref 0–5)

## 2019-06-22 MED ORDER — DIAZEPAM 5 MG PO TABS: 5.0000 mg | ORAL_TABLET | Freq: Once | ORAL | 0 refills | Status: DC | PRN

## 2019-06-22 NOTE — Telephone Encounter (Signed)
[name],  Looks like stool sample for testing was cancelled, fecal calprotectin has resulted,pt requesting results. Please advise, thank you.

## 2019-06-22 NOTE — Patient Instructions (Signed)

## 2019-06-22 NOTE — Progress Notes (Signed)
   06/22/19 3:34 PM   Daniel Knox 28-Feb-1990 [id number]  CC: Solitary kidney, bladder mass  HPI: I saw Daniel Knox in urology clinic today for the above issues.  He is a healthy 29 year old male with past medical history notable for factor V Leiden deficiency who recently had a CT scan in urgent care for right-sided lower abdominal pain.  This showed no obvious etiology for his pain, however there was a 4 cm mass within the bladder at the vicinity of the left UVJ with a dilated left proximal ureter and an atrophic left renal remnant.  The right kidney was normal-appearing.  This was favored to represent a proteinaceous ureterocele versus solid mass.  He denies any left-sided flank pain.  He denies any urinary symptoms or gross hematuria.  He did not know he only had 1 kidney.  There is no prior cross-sectional imaging to review.  He is married and does not have any children.  He denies any weight loss.  Renal function is normal with creatinine of 1.07, EGFR greater than 60.  Urinalysis today benign with 0 RBCs, 0 WBCs, no bacteria, nitrite negative.  PMH: Past Medical History:  Diagnosis Date  . Allergies   . Factor V deficiency ([place])   . H/O blood clots     Surgical History: Past Surgical History:  Procedure Laterality Date  . LEG SURGERY     broken leg    Family History: Family History  Problem Relation Age of Onset  . Healthy Mother   . Factor IX deficiency Father   . Cancer Neg Hx   . Heart failure Neg Hx   . Diabetes Neg Hx   . Hyperlipidemia Neg Hx     Social History:  reports that he has never smoked. He has never used smokeless tobacco. He reports current alcohol use. He reports that he does not use drugs.  Physical Exam: BP 125/73 (BP Location: Left Arm, Patient Position: Sitting, Cuff Size: Normal)   Pulse 66   Ht '5\' 10"'$  (1.778 m)   Wt 165 lb 4.8 oz (75 kg)   BMI 23.72 kg/m    Constitutional:  Alert and oriented, No acute distress. Cardiovascular: No  clubbing, cyanosis, or edema. Respiratory: Normal respiratory effort, no increased work of breathing. GI: Abdomen is soft, nontender, nondistended, no abdominal masses GU: Phallus with patent meatus, testicles 20 cc and descended bilaterally, vas deferens present and easily palpable bilaterally  Laboratory Data: Reviewed  Pertinent Imaging: I have personally reviewed the CT imaging, see HPI for details.  4 cm bladder mass at the left UVJ with dilated upstream ureter and completely atrophic left kidney.  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  I suspect this is a congenital finding and would be very unlikely to be malignancy in his age group.  His renal function is normal.  We discussed the importance of protecting his remaining kidney on the right side by avoiding NSAIDs, smoking, hypertension, healthy diet and exercise.  I also recommended cystoscopy to visualize this mass in the bladder, and certainly if anything appeared worrisome would pursue biopsy/TURBT in the operating room.  Follow-up next week for cystoscopy, Valium prior  [name], MD 06/22/2019  [facility] [address] [place],  [id number] [phone]

## 2019-06-22 NOTE — Telephone Encounter (Signed)
Pt inquired about results of stool sample.

## 2019-06-23 ENCOUNTER — Telehealth: Payer: Self-pay | Admitting: Gastroenterology

## 2019-06-23 NOTE — Telephone Encounter (Signed)
Pt notified via mychart

## 2019-06-23 NOTE — Telephone Encounter (Signed)
Patient aware and will call back if he decides to schedule the ECL.

## 2019-06-23 NOTE — Telephone Encounter (Signed)
See note below and advise. 

## 2019-06-23 NOTE — Telephone Encounter (Signed)
Records from Urology reviewed. As discussed with the patient and outlined in my last note, EGD and colonoscopy recommended. May schedule now or have an office visit first if the patient prefers. Thanks.

## 2019-06-23 NOTE — Telephone Encounter (Signed)
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 [facility] suggested a cystoscopy (in [place]), but also to follow back up with GI due to mass not being cause of GI abd pain. Pt wanting to know next steps. Please advise.

## 2019-06-23 NOTE — Telephone Encounter (Signed)
Fecal calprotectin was normal. Thank you.

## 2019-06-26 ENCOUNTER — Encounter: Payer: Self-pay | Admitting: Family Medicine

## 2019-06-26 ENCOUNTER — Other Ambulatory Visit: Payer: Self-pay

## 2019-06-26 ENCOUNTER — Encounter: Payer: Self-pay | Admitting: Gastroenterology

## 2019-06-26 ENCOUNTER — Ambulatory Visit: Payer: BC Managed Care – PPO | Admitting: Family Medicine

## 2019-06-26 VITALS — BP 128/84 | HR 49 | Ht 70.0 in | Wt 164.0 lb

## 2019-06-26 DIAGNOSIS — N3289 Other specified disorders of bladder: Secondary | ICD-10-CM | POA: Diagnosis not present

## 2019-06-26 DIAGNOSIS — Q600 Renal agenesis, unilateral: Secondary | ICD-10-CM | POA: Diagnosis not present

## 2019-06-26 DIAGNOSIS — R109 Unspecified abdominal pain: Secondary | ICD-10-CM

## 2019-06-26 DIAGNOSIS — IMO0002 Reserved for concepts with insufficient information to code with codable children: Secondary | ICD-10-CM

## 2019-06-26 DIAGNOSIS — R197 Diarrhea, unspecified: Secondary | ICD-10-CM

## 2019-06-26 DIAGNOSIS — D682 Hereditary deficiency of other clotting factors: Secondary | ICD-10-CM | POA: Insufficient documentation

## 2019-06-26 DIAGNOSIS — K9049 Malabsorption due to intolerance, not elsewhere classified: Secondary | ICD-10-CM

## 2019-06-26 NOTE — Assessment & Plan Note (Signed)
Improved with metamucil. Already seeing GI and reviewed [name] note with plan for endoscopy/colonoscopy. Discussed continued metamucil, keeping food diary to see if certain foods trigger symptoms. Weight loss has stabilized and blood work reassuring. Will f/u up GI work-up and advised returning if symptoms persisting w/o answer.

## 2019-06-26 NOTE — Progress Notes (Signed)
Subjective:     Daniel Knox is a 29 y.o. male presenting for Establish Care and Abdominal Pain     HPI  #Abdomnial pain - seeing GI - planning to get colonoscopy and endoscopy - stooling urgency  - told to start taking fiber which has made stools more regular - sometimes terrible  - diet: factor v leiden - limits greens, no fruit, some veggies -- better than average diet - limits fast food - weight loss in setting of stoolling 10-15 lbs over the last - symptoms started 2 months ago - no currently with abdominal pain - comes and goes  #fruit - thinks he has a fruit allergy - used to be able to eat fruit but symptoms started more recently - will get indigestion with fruit - so he stopped eating fresh fruit - if cooked or processed   Review of Systems   Social History   Tobacco Use  Smoking Status Never Smoker  Smokeless Tobacco Current User  . Types: Chew        Objective:    BP Readings from Last 3 Encounters:  06/26/19 128/84  06/22/19 125/73  05/30/19 100/62   Wt Readings from Last 3 Encounters:  06/26/19 164 lb (74.4 kg)  06/22/19 165 lb 4.8 oz (75 kg)  05/30/19 161 lb 3.2 oz (73.1 kg)    BP 128/84   Pulse (!) 49   Ht 5\' 10"  (1.778 m)   Wt 164 lb (74.4 kg)   SpO2 98%   BMI 23.53 kg/m    Physical Exam Constitutional:      Appearance: Normal appearance. He is not ill-appearing or diaphoretic.  HENT:     Right Ear: External ear normal.     Left Ear: External ear normal.  Eyes:     General: No scleral icterus.    Extraocular Movements: Extraocular movements intact.     Conjunctiva/sclera: Conjunctivae normal.  Cardiovascular:     Rate and Rhythm: Normal rate and regular rhythm.     Heart sounds: No murmur heard.   Pulmonary:     Effort: Pulmonary effort is normal. No respiratory distress.     Breath sounds: Normal breath sounds. No wheezing.  Musculoskeletal:     Cervical back: Neck supple.  Skin:    General: Skin is warm and  dry.  Neurological:     Mental Status: He is alert. Mental status is at baseline.  Psychiatric:        Mood and Affect: Mood normal.           Assessment & Plan:   Problem List Items Addressed This Visit      Other   Solitary kidney   Bladder mass    Reviewed CT and urology note - pt to get cystoscopy +/- biopsy if concerning features.       Diarrhea    Improved with metamucil. Already seeing GI and reviewed [name] note with plan for endoscopy/colonoscopy. Discussed continued metamucil, keeping food diary to see if certain foods trigger symptoms. Weight loss has stabilized and blood work reassuring. Will f/u up GI work-up and advised returning if symptoms persisting w/o answer.       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 Discussed option of allergy referral vs trial and error to see if he can tolerate some fruits. He will consider allergist but declined today.        Other Visit Diagnoses  Abdominal pain, unspecified abdominal location    -  Primary       Return in about 1 year (around 06/25/2020).  Lesleigh Noe, MD

## 2019-06-26 NOTE — Patient Instructions (Signed)
Consider a food diary - to watch for symptoms  Look at what you eat and check to make sure you are getting the following:  Iron Calcium Vitamin D  If you want to see an allergist call or mychart

## 2019-06-26 NOTE — Assessment & Plan Note (Signed)
Reviewed CT and urology note - pt to get cystoscopy +/- biopsy if concerning features.

## 2019-06-26 NOTE — Assessment & Plan Note (Signed)
Difficulty with raw fruits, but has not eaten in years. Discussed option of allergy referral vs trial and error to see if he can tolerate some fruits. He will consider allergist but declined today.

## 2019-07-12 ENCOUNTER — Ambulatory Visit (INDEPENDENT_AMBULATORY_CARE_PROVIDER_SITE_OTHER): Payer: BC Managed Care – PPO | Admitting: Urology

## 2019-07-12 ENCOUNTER — Other Ambulatory Visit: Payer: Self-pay

## 2019-07-12 ENCOUNTER — Encounter: Payer: Self-pay | Admitting: Urology

## 2019-07-12 ENCOUNTER — Other Ambulatory Visit: Payer: Self-pay | Admitting: Urology

## 2019-07-12 VITALS — BP 126/80 | HR 61 | Ht 69.0 in | Wt 160.0 lb

## 2019-07-12 DIAGNOSIS — N3289 Other specified disorders of bladder: Secondary | ICD-10-CM | POA: Diagnosis not present

## 2019-07-12 MED ORDER — LIDOCAINE HCL URETHRAL/MUCOSAL 2 % EX GEL
1.0000 | Freq: Once | CUTANEOUS | Status: AC
Start: 2019-07-12 — End: 2019-07-12
  Administered 2019-07-12: 1 via URETHRAL

## 2019-07-12 NOTE — Patient Instructions (Signed)
Transurethral Resection of Bladder Tumor  Transurethral resection of a bladder tumor is the removal (resection) of a cancerous growth (tumor) on the inside wall of the bladder. The bladder is the organ that holds urine. The tumor is removed through the tube that carries urine out of the body (urethra). In a transurethral resection, a thin telescope with a light, a tiny camera, and an electric cutting edge (resectoscope) is passed through the urethra. In men, the opening of the urethra is at the end of the penis. In women, it is just above the opening of the vagina. Tell a health care provider about:  Any allergies you have.  All medicines you are taking, including vitamins, herbs, eye drops, creams, and over-the-counter medicines.  Any problems you or family members have had with anesthetic medicines.  Any blood disorders you have.  Any surgeries you have had.  Any medical conditions you have.  Any recent urinary tract infections you have had.  Whether you are pregnant or may be pregnant. What are the risks? Generally, this is a safe procedure. However, problems may occur, including:  Infection.  Bleeding.  Allergic reactions to medicines.  Damage to nearby structures or organs, such as: ? The urethra. ? The tubes that drain urine from the kidneys into the bladder (ureters).  Pain and burning during urination.  Difficulty urinating due to partial blockage of the urethra.  Inability to urinate (urinary retention). What happens before the procedure? Staying hydrated Follow instructions from your health care provider about hydration, which may include:  Up to 2 hours before the procedure - you may continue to drink clear liquids, such as water, clear fruit juice, black coffee, and plain tea.  Eating and drinking restrictions Follow instructions from your health care provider about eating and drinking, which may include:  8 hours before the procedure - stop eating heavy  meals or foods, such as meat, fried foods, or fatty foods.  6 hours before the procedure - stop eating light meals or foods, such as toast or cereal.  6 hours before the procedure - stop drinking milk or drinks that contain milk.  2 hours before the procedure - stop drinking clear liquids. Medicines Ask your health care provider about:  Changing or stopping your regular medicines. This is especially important if you are taking diabetes medicines or blood thinners.  Taking medicines such as aspirin and ibuprofen. These medicines can thin your blood. Do not take these medicines unless your health care provider tells you to take them.  Taking over-the-counter medicines, vitamins, herbs, and supplements. Tests You may have exams or tests, including:  Physical exam.  Blood tests.  Urine tests.  Electrocardiogram (ECG). This test measures the electrical activity of the heart. General instructions  Plan to have someone take you home from the hospital or clinic.  Ask your health care provider how your surgical site will be marked or identified.  Ask your health care provider what steps will be taken to help prevent infection. These may include: ? Washing skin with a germ-killing soap. ? Taking antibiotic medicine. What happens during the procedure?  An IV will be inserted into one of your veins.  You will be given one or more of the following: ? A medicine to help you relax (sedative). ? A medicine to make you fall asleep (general anesthetic). ? A medicine that is injected into your spine to numb the area below and slightly above the injection site (spinal anesthetic).  Your legs will be   placed in foot rests (stirrups) so that your legs are apart and your knees are bent.  The resectoscope will be passed through your urethra and into your bladder.  The part of your bladder that is affected by the tumor will be resected using the cutting edge of the resectoscope.  The  resectoscope will be removed.  A thin, flexible tube (catheter) will be passed through your urethra and into your bladder. The catheter will drain urine into a bag outside of your body. ? Fluid may be passed through the catheter to keep the catheter open. The procedure may vary among health care providers and hospitals. What happens after the procedure?  Your blood pressure, heart rate, breathing rate, and blood oxygen level will be monitored until you leave the hospital or clinic.  You may continue to receive fluids and medicines through an IV.  You will have some pain. You will be given pain medicine to relieve pain.  You will have a catheter to drain your urine. ? You will have blood in your urine. Your catheter may be kept in until your urine is clear. ? The amount of urine will be monitored. If necessary, your bladder may be rinsed out (irrigated) by passing fluid through your catheter.  You will be encouraged to walk around as soon as possible.  You may have to wear compression stockings. These stockings help to prevent blood clots and reduce swelling in your legs.  Do not drive for 24 hours if you were given a sedative during your procedure. Summary  Transurethral resection of a bladder tumor is the removal (resection) of a cancerous growth (tumor) on the inside wall of the bladder.  To do this procedure, your health care provider uses a thin telescope with a light, a tiny camera, and an electric cutting edge (resectoscope).  Follow your health care provider's instructions. You may need to stop or change certain medicines, and you may be told to stop eating and drinking several hours before the procedure.  Your blood pressure, heart rate, breathing rate, and blood oxygen level will be monitored until you leave the hospital or clinic.  You may have to wear compression stockings. These stockings help to prevent blood clots and reduce swelling in your legs. This information is  not intended to replace advice given to you by your health care provider. Make sure you discuss any questions you have with your health care provider. Document Revised: 07/30/2017 Document Reviewed: 07/30/2017 Elsevier Patient Education  2020 [employer]   Transurethral Resection of Bladder Tumor, Care After This sheet gives you information about how to care for yourself after your procedure. Your health care provider may also give you more specific instructions. If you have problems or questions, contact your health care provider. What can I expect after the procedure? After the procedure, it is common to have:  A small amount of blood in your urine for up to 2 weeks.  Soreness or mild pain from your catheter. After your catheter is removed, you may have mild soreness, especially when urinating.  Pain in your lower abdomen. Follow these instructions at home: Medicines   Take over-the-counter and prescription medicines only as told by your health care provider.  If you were prescribed an antibiotic medicine, take it as told by your health care provider. Do not stop taking the antibiotic even if you start to feel better.  Do not drive for 24 hours if you were given a sedative during your procedure.  Ask   your health care provider if the medicine prescribed to you: ? Requires you to avoid driving or using heavy machinery. ? Can cause constipation. You may need to take these actions to prevent or treat constipation:  Take over-the-counter or prescription medicines.  Eat foods that are high in fiber, such as beans, whole grains, and fresh fruits and vegetables.  Limit foods that are high in fat and processed sugars, such as fried or sweet foods. Activity  Return to your normal activities as told by your health care provider. Ask your health care provider what activities are safe for you.  Do not lift anything that is heavier than 10 lb (4.5 kg), or the limit that you are told,  until your health care provider says that it is safe.  Avoid intense physical activity for as long as told by your health care provider.  Rest as told by your health care provider.  Avoid sitting for a long time without moving. Get up to take short walks every 1-2 hours. This is important to improve blood flow and breathing. Ask for help if you feel weak or unsteady. General instructions   Do not drink alcohol for as long as told by your health care provider. This is especially important if you are taking prescription pain medicines.  Do not take baths, swim, or use a hot tub until your health care provider approves. Ask your health care provider if you may take showers. You may only be allowed to take sponge baths.  If you have a catheter, follow instructions from your health care provider about caring for your catheter and your drainage bag.  Drink enough fluid to keep your urine pale yellow.  Wear compression stockings as told by your health care provider. These stockings help to prevent blood clots and reduce swelling in your legs.  Keep all follow-up visits as told by your health care provider. This is important. ? You will need to be followed closely with regular checks of your bladder and urethra (cystoscopies) to make sure that the cancer does not come back. Contact a health care provider if:  You have pain that gets worse or does not improve with medicine.  You have blood in your urine for more than 2 weeks.  You have cloudy or bad-smelling urine.  You become constipated. Signs of constipation may include having: ? Fewer than three bowel movements in a week. ? Difficulty having a bowel movement. ? Stools that are dry, hard, or larger than normal.  You have a fever. Get help right away if:  You have: ? Severe pain. ? Bright red blood in your urine. ? Blood clots in your urine. ? A lot of blood in your urine.  Your catheter has been removed and you are not able to  urinate.  You have a catheter in place and the catheter is not draining urine. Summary  After your procedure, it is common to have a small amount of blood in your urine, soreness or mild pain from your catheter, and pain in your lower abdomen.  Take over-the-counter and prescription medicines only as told by your health care provider.  Rest as told by your health care provider. Follow your health care provider's instructions about returning to normal activities. Ask what activities are safe for you.  If you have a catheter, follow instructions from your health care provider about caring for your catheter and your drainage bag.  Get help right away if you cannot urinate, you have severe   pain, or you have bright red blood or blood clots in your urine. This information is not intended to replace advice given to you by your health care provider. Make sure you discuss any questions you have with your health care provider. Document Revised: 07/29/2017 Document Reviewed: 07/29/2017 Elsevier Patient Education  2020 [employer] Inc.  

## 2019-07-12 NOTE — Progress Notes (Signed)
Cystoscopy Procedure Note:  Indication: Bladder mass  After informed consent and discussion of the procedure and its risks, Daniel Knox was positioned and prepped in the standard fashion. Cystoscopy was performed with a flexible cystoscope. The urethra, bladder neck and entire bladder was visualized in a standard fashion. The prostate was small.  There was a large 3 to 4 cm mass emanating from the left side the bladder.  This was bullous in appearance with no papillary changes.  Directed cytology sent.  Retroflexion confirmed a large intravesical mass.  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  With his age and solitary right kidney, this is most likely benign, however we discussed at length that without biopsy or resection of the mass we cannot say this definitively.  We also discussed that with the size, I would anticipate he may have troubles with gross hematuria or even urinary complaints secondary to blockage in the future.  I recommended cystoscopy and bladder biopsy/resection with TURBT in the OR. We discussed transurethral resection of bladder tumor (TURBT) and risks and benefits at length. This is typically a 1 to 2-hour procedure done under general anesthesia in the operating room.  A scope is inserted through the urethra and used to resect abnormal tissue within the bladder, which is then sent to the pathologist to determine grade and stage of the tumor.  Risks include bleeding, infection, need for temporary Foley placement, and bladder perforation.  Treatment strategies are based on the type of tumor and depth of invasion, however I think  the most likely pathology would be benign in his case.  He is very hesitant to proceed as he currently feels well.  He would like to discuss with his wife prior to making a final decision.  If he defers biopsy and resection in the OR, I at least recommended close follow-up with a repeat bladder ultrasound in 3 months to evaluate for any change in size.  Will call next week to schedule TURBT, if patient refuses will schedule 3 month follow up with bladder US  Daniel Madrid, MD 07/12/2019

## 2019-07-13 LAB — URINALYSIS, COMPLETE
Bilirubin, UA: NEGATIVE
Glucose, UA: NEGATIVE
Ketones, UA: NEGATIVE
Leukocytes,UA: NEGATIVE
Nitrite, UA: NEGATIVE
Protein,UA: NEGATIVE
RBC, UA: NEGATIVE
Specific Gravity, UA: 1.02 (ref 1.005–1.030)
Urobilinogen, Ur: 0.2 mg/dL (ref 0.2–1.0)
pH, UA: 5 (ref 5.0–7.5)

## 2019-07-13 LAB — CYTOLOGY - NON PAP

## 2019-07-13 LAB — MICROSCOPIC EXAMINATION
Bacteria, UA: NONE SEEN
Epithelial Cells (non renal): NONE SEEN /hpf (ref 0–10)
RBC, Urine: NONE SEEN /hpf (ref 0–2)

## 2019-07-19 LAB — PATHOLOGY

## 2019-07-20 ENCOUNTER — Other Ambulatory Visit: Payer: Self-pay | Admitting: Urology

## 2019-07-20 ENCOUNTER — Telehealth: Payer: Self-pay

## 2019-07-20 DIAGNOSIS — N3289 Other specified disorders of bladder: Secondary | ICD-10-CM

## 2019-07-20 NOTE — Telephone Encounter (Signed)
-----   Message from Billey Co, MD sent at 07/18/2019 12:54 PM EDT ----- No cancer cells on urine sample, would still recommend removal of bladder mass. If he does not want to proceed with TURBT as recommended last visit, needs follow up with me in 3 months to re-discuss, thanks  [name], MD 07/18/2019

## 2019-07-20 NOTE — Telephone Encounter (Signed)
Called pt informed him of the information below. Pt gave verbal understanding. Pt declines any surgical intervention at this time. Pt scheduled for 3 month follow up.

## 2019-07-27 ENCOUNTER — Ambulatory Visit (AMBULATORY_SURGERY_CENTER): Payer: Self-pay | Admitting: *Deleted

## 2019-07-27 ENCOUNTER — Other Ambulatory Visit: Payer: Self-pay

## 2019-07-27 VITALS — Ht 70.0 in | Wt 165.0 lb

## 2019-07-27 DIAGNOSIS — Z01818 Encounter for other preprocedural examination: Secondary | ICD-10-CM

## 2019-07-27 DIAGNOSIS — R1084 Generalized abdominal pain: Secondary | ICD-10-CM

## 2019-07-27 MED ORDER — SUTAB 1479-225-188 MG PO TABS
1.0000 | ORAL_TABLET | Freq: Once | ORAL | 0 refills | Status: AC
Start: 2019-07-27 — End: 2019-07-27

## 2019-07-27 NOTE — Progress Notes (Signed)
No egg or soy allergy known to patient  No issues with past sedation with any surgeries or procedures no intubation problems in the past  No diet pills per patient No home 02 use per patient  No blood thinners per patient  Pt denies issues with constipation  No A fib or A flutter  EMMI video to pt or MyChart  COVID 19 guidelines implemented in [facility] today   [employer] Coupon given to pt in [facility] today   Due to the COVID-19 pandemic we are asking patients to follow these guidelines. Please only bring one care partner. Please be aware that your care partner may wait in the car in the parking lot or if they feel like they will be too hot to wait in the car, they may wait in the lobby on the 4th floor. All care partners are required to wear a mask the entire time (we do not have any that we can provide them), they need to practice social distancing, and we will do a Covid check for all patient's and care partners when you arrive. Also we will check their temperature and your temperature. If the care partner waits in their car they need to stay in the parking lot the entire time and we will call them on their cell phone when the patient is ready for discharge so they can bring the car to the front of the building. Also all patient's will need to wear a mask into building.

## 2019-07-28 ENCOUNTER — Encounter: Payer: Self-pay | Admitting: Gastroenterology

## 2019-08-07 ENCOUNTER — Telehealth: Payer: Self-pay | Admitting: Gastroenterology

## 2019-08-07 ENCOUNTER — Ambulatory Visit (INDEPENDENT_AMBULATORY_CARE_PROVIDER_SITE_OTHER): Payer: BC Managed Care – PPO

## 2019-08-07 ENCOUNTER — Other Ambulatory Visit: Payer: Self-pay | Admitting: Gastroenterology

## 2019-08-07 DIAGNOSIS — Z1159 Encounter for screening for other viral diseases: Secondary | ICD-10-CM

## 2019-08-07 LAB — SARS CORONAVIRUS 2 (TAT 6-24 HRS): SARS Coronavirus 2: NEGATIVE

## 2019-08-07 NOTE — Telephone Encounter (Signed)
Patient called states he still does not have his prep medication from the pharmacy

## 2019-08-07 NOTE — Telephone Encounter (Signed)
Pt states pharmacy needs prior authorization.  I called pharmacy and asked them to run coupon code- she does and cost is $40.00  I called pt and he is aware that RX is ready to pick up

## 2019-08-09 ENCOUNTER — Encounter: Payer: Self-pay | Admitting: Gastroenterology

## 2019-08-09 ENCOUNTER — Other Ambulatory Visit: Payer: Self-pay

## 2019-08-09 ENCOUNTER — Ambulatory Visit (AMBULATORY_SURGERY_CENTER): Payer: BC Managed Care – PPO | Admitting: Gastroenterology

## 2019-08-09 ENCOUNTER — Encounter: Payer: Self-pay | Admitting: Family Medicine

## 2019-08-09 VITALS — BP 96/58 | HR 57 | Temp 98.4°F | Resp 25 | Ht 70.0 in | Wt 165.0 lb

## 2019-08-09 DIAGNOSIS — K2950 Unspecified chronic gastritis without bleeding: Secondary | ICD-10-CM | POA: Diagnosis not present

## 2019-08-09 DIAGNOSIS — K5730 Diverticulosis of large intestine without perforation or abscess without bleeding: Secondary | ICD-10-CM | POA: Diagnosis not present

## 2019-08-09 DIAGNOSIS — B9681 Helicobacter pylori [H. pylori] as the cause of diseases classified elsewhere: Secondary | ICD-10-CM | POA: Diagnosis not present

## 2019-08-09 DIAGNOSIS — K2970 Gastritis, unspecified, without bleeding: Secondary | ICD-10-CM | POA: Diagnosis not present

## 2019-08-09 DIAGNOSIS — R109 Unspecified abdominal pain: Secondary | ICD-10-CM | POA: Diagnosis not present

## 2019-08-09 DIAGNOSIS — R1084 Generalized abdominal pain: Secondary | ICD-10-CM | POA: Diagnosis not present

## 2019-08-09 DIAGNOSIS — D122 Benign neoplasm of ascending colon: Secondary | ICD-10-CM

## 2019-08-09 DIAGNOSIS — K635 Polyp of colon: Secondary | ICD-10-CM

## 2019-08-09 DIAGNOSIS — K9049 Malabsorption due to intolerance, not elsewhere classified: Secondary | ICD-10-CM

## 2019-08-09 DIAGNOSIS — K259 Gastric ulcer, unspecified as acute or chronic, without hemorrhage or perforation: Secondary | ICD-10-CM | POA: Diagnosis not present

## 2019-08-09 MED ORDER — PANTOPRAZOLE SODIUM 40 MG PO TBEC
40.0000 mg | DELAYED_RELEASE_TABLET | Freq: Two times a day (BID) | ORAL | 2 refills | Status: DC
Start: 2019-08-09 — End: 2019-12-21

## 2019-08-09 MED ORDER — SODIUM CHLORIDE 0.9 % IV SOLN: 500.0000 mL | Freq: Once | INTRAVENOUS | Status: DC

## 2019-08-09 NOTE — Patient Instructions (Signed)
Handouts on gastritis, polyps, diverticulosis, and high fiber diet given to you today  Await pathology results  Start taking Pantoprazole 40 mg two times a day for 12 weeks  A high fiber diet is recommended    YOU HAD AN ENDOSCOPIC PROCEDURE TODAY AT [facility]:   Refer to the procedure report that was given to you for any specific questions about what was found during the examination.  If the procedure report does not answer your questions, please call your gastroenterologist to clarify.  If you requested that your care partner not be given the details of your procedure findings, then the procedure report has been included in a sealed envelope for you to review at your convenience later.  YOU SHOULD EXPECT: Some feelings of bloating in the abdomen. Passage of more gas than usual.  Walking can help get rid of the air that was put into your GI tract during the procedure and reduce the bloating. If you had a lower endoscopy (such as a colonoscopy or flexible sigmoidoscopy) you may notice spotting of blood in your stool or on the toilet paper. If you underwent a bowel prep for your procedure, you may not have a normal bowel movement for a few days.  Please Note:  You might notice some irritation and congestion in your nose or some drainage.  This is from the oxygen used during your procedure.  There is no need for concern and it should clear up in a day or so.  SYMPTOMS TO REPORT IMMEDIATELY:   Following lower endoscopy (colonoscopy or flexible sigmoidoscopy):  Excessive amounts of blood in the stool  Significant tenderness or worsening of abdominal pains  Swelling of the abdomen that is new, acute  Fever of 100F or higher   Following upper endoscopy (EGD)  Vomiting of blood or coffee ground material  New chest pain or pain under the shoulder blades  Painful or persistently difficult swallowing  New shortness of breath  Fever of 100F or higher  Black, tarry-looking  stools  For urgent or emergent issues, a gastroenterologist can be reached at any hour by calling [phone]. Do not use MyChart messaging for urgent concerns.    DIET:  We do recommend a small meal at first, but then you may proceed to your regular diet.  Drink plenty of fluids but you should avoid alcoholic beverages for 24 hours.  ACTIVITY:  You should plan to take it easy for the rest of today and you should NOT DRIVE or use heavy machinery until tomorrow (because of the sedation medicines used during the test).    FOLLOW UP: Our staff will call the number listed on your records 48-72 hours following your procedure to check on you and address any questions or concerns that you may have regarding the information given to you following your procedure. If we do not reach you, we will leave a message.  We will attempt to reach you two times.  During this call, we will ask if you have developed any symptoms of COVID 19. If you develop any symptoms (ie: fever, flu-like symptoms, shortness of breath, cough etc.) before then, please call [phone].  If you test positive for Covid 19 in the 2 weeks post procedure, please call and report this information to [place].    If any biopsies were taken you will be contacted by phone or by letter within the next 1-3 weeks.  Please call us at [phone] if you have not heard  about the biopsies in 3 weeks.    SIGNATURES/CONFIDENTIALITY: You and/or your care partner have signed paperwork which will be entered into your electronic medical record.  These signatures attest to the fact that that the information above on your After Visit Summary has been reviewed and is understood.  Full responsibility of the confidentiality of this discharge information lies with you and/or your care-partner.

## 2019-08-09 NOTE — Op Note (Signed)
[place] Patient Name: Daniel Knox Procedure Date: 08/09/2019 1:53 PM MRN: [id number] Endoscopist: Thornton Park MD, MD Age: 29 Referring MD:  Date of Birth: 1990-09-06 Gender: Male Account #: [id number] Procedure:                Upper GI endoscopy Indications:              Abdominal pain Medicines:                Monitored Anesthesia Care Procedure:                Pre-Anesthesia Assessment:                           - Prior to the procedure, a History and Physical                            was performed, and patient medications and                            allergies were reviewed. The patient's tolerance of                            previous anesthesia was also reviewed. The risks                            and benefits of the procedure and the sedation                            options and risks were discussed with the patient.                            All questions were answered, and informed consent                            was obtained. Prior Anticoagulants: The patient has                            taken no previous anticoagulant or antiplatelet                            agents. ASA Grade Assessment: II - A patient with                            mild systemic disease. After reviewing the risks                            and benefits, the patient was deemed in                            satisfactory condition to undergo the procedure.                           After obtaining informed consent, the endoscope was  passed under direct vision. Throughout the                            procedure, the patient's blood pressure, pulse, and                            oxygen saturations were monitored continuously. The                            Endoscope was introduced through the mouth, and                            advanced to the third part of duodenum. The upper                            GI endoscopy was accomplished without  difficulty.                            The patient tolerated the procedure well. Scope In: Scope Out: Findings:                 The examined esophagus was normal.                           Diffuse mild inflammation characterized by                            erythema, friability and granularity was found in                            the gastric body. Biopsies were taken from the                            antrum, body, and fundus with a cold forceps for                            histology. Estimated blood loss was minimal.                           White nummular lesions were noted in the gastric                            antrum. Biopsies were taken with a cold forceps for                            histology. Estimated blood loss was minimal.                           The examined duodenum was normal. Biopsies were                            taken with a cold forceps for histology. Estimated  blood loss was minimal. Complications:            No immediate complications. Estimated blood loss:                            Minimal. Estimated Blood Loss:     Estimated blood loss was minimal. Impression:               - Normal esophagus.                           - Gastritis. Biopsied.                           - White nummular lesions in gastric mucosa. These                            are of unclear clinical significance. Biopsied.                           - Normal examined duodenum. Biopsied. Recommendation:           - Patient has a contact number available for                            emergencies. The signs and symptoms of potential                            delayed complications were discussed with the                            patient. Return to normal activities tomorrow.                            Written discharge instructions were provided to the                            patient.                           - Resume previous diet.                            - Continue present medications.                           - Start pantoprazole 40 mg BID x 12 weeks.                           - Await pathology results.                           - No aspirin, ibuprofen, naproxen, or other                            non-steroidal anti-inflammatory drugs.                           -  Proceed with colonoscopy with as previously                            planned. Thornton Park MD, MD 08/09/2019 2:32:40 PM This report has been signed electronically.

## 2019-08-09 NOTE — Op Note (Signed)
[facility] Patient Name: Daniel Knox Procedure Date: 08/09/2019 1:52 PM MRN: [id number] Endoscopist: Thornton Park MD, MD Age: 29 Referring MD:  Date of Birth: Nov 09, 1990 Gender: Male Account #: [id number] Procedure:                Colonoscopy Indications:              Abdominal pain Medicines:                Monitored Anesthesia Care Procedure:                Pre-Anesthesia Assessment:                           - Prior to the procedure, a History and Physical                            was performed, and patient medications and                            allergies were reviewed. The patient's tolerance of                            previous anesthesia was also reviewed. The risks                            and benefits of the procedure and the sedation                            options and risks were discussed with the patient.                            All questions were answered, and informed consent                            was obtained. Prior Anticoagulants: The patient has                            taken no previous anticoagulant or antiplatelet                            agents. ASA Grade Assessment: II - A patient with                            mild systemic disease. After reviewing the risks                            and benefits, the patient was deemed in                            satisfactory condition to undergo the procedure.                           After obtaining informed consent, the colonoscope  was passed under direct vision. Throughout the                            procedure, the patient's blood pressure, pulse, and                            oxygen saturations were monitored continuously. The                            Colonoscope was introduced through the anus and                            advanced to the 10 cm into the ileum. A second                            forward view of the right colon was performed.  The                            colonoscopy was performed without difficulty. The                            patient tolerated the procedure well. The quality                            of the bowel preparation was good. The terminal                            ileum, ileocecal valve, appendiceal orifice, and                            rectum were photographed. Scope In: 2:08:12 PM Scope Out: 2:26:21 PM Scope Withdrawal Time: 0 hours 14 minutes 55 seconds  Total Procedure Duration: 0 hours 18 minutes 9 seconds  Findings:                 The perianal and digital rectal examinations were                            normal.                           A few small-mouthed diverticula were found in the                            sigmoid colon.                           A 3 mm polyp was found in the ascending colon. The                            polyp was sessile. The polyp was removed with a                            cold snare. Resection and retrieval were complete.  Estimated blood loss was minimal.                           The colon (entire examined portion) appeared                            normal. Biopsies were taken from the right colon,                            transverse colon, and left colon with a cold                            forceps for histology. Estimated blood loss was                            minimal.                           One patchy area of mucosa in the terminal ileum was                            mildly erythematous. Biopsies were taken with a                            cold forceps for histology. Estimated blood loss                            was minimal.                           The exam was otherwise without abnormality on                            direct and retroflexion views. Complications:            No immediate complications. Estimated blood loss:                            Minimal. Estimated Blood Loss:     Estimated blood  loss was minimal. Impression:               - Diverticulosis in the sigmoid colon.                           - One 3 mm polyp in the ascending colon, removed                            with a cold snare. Resected and retrieved.                           - The entire examined colon is normal. Biopsied.                           - One patchy area of erythematous mucosa in the  terminal ileum. No findings were of unclear                            clinical significance. Biopsied.                           - No obvious source for abdominal pain identified                            on this study. Recommendation:           - Patient has a contact number available for                            emergencies. The signs and symptoms of potential                            delayed complications were discussed with the                            patient. Return to normal activities tomorrow.                            Written discharge instructions were provided to the                            patient.                           - Follow a high fiber diet. Drink at least 64                            ounces of water daily. Add a daily stool bulking                            agent such as psyllium (an exampled would be                            Metamucil).                           - Continue present medications.                           - Await pathology results.                           - Repeat colonoscopy date to be determined after                            pending pathology results are reviewed for                            surveillance.                           - Emerging  evidence supports eating a diet of                            fruits, vegetables, grains, calcium, and yogurt                            while reducing red meat and alcohol may reduce the                            risk of colon cancer. Thornton Park MD, MD 08/09/2019 2:38:20 PM This  report has been signed electronically.

## 2019-08-09 NOTE — Progress Notes (Signed)
Called to room to assist during endoscopic procedure.  Patient ID and intended procedure confirmed with present staff. Received instructions for my participation in the procedure from the performing physician.  

## 2019-08-09 NOTE — Progress Notes (Signed)
Report to PACU, RN, vss, BBS= Clear.  

## 2019-08-09 NOTE — Progress Notes (Signed)
Pt's states no medical or surgical changes since previsit or office visit.  Vitals- Courtney 

## 2019-08-11 ENCOUNTER — Telehealth: Payer: Self-pay

## 2019-08-11 NOTE — Telephone Encounter (Signed)
  Follow up Call-  Call back number 08/09/2019  Post procedure Call Back phone  # [phone]  Permission to leave phone message Yes  Some recent data might be hidden     Patient questions:  Do you have a fever, pain , or abdominal swelling? No. Pain Score  0 *  Have you tolerated food without any problems? Yes.    Have you been able to return to your normal activities? Yes.    Do you have any questions about your discharge instructions: Diet   No. Medications  No. Follow up visit  No.  Do you have questions or concerns about your Care? No.  Actions: * If pain score is 4 or above: No action needed, pain <4.  1. Have you developed a fever since your procedure? no  2.   Have you had an respiratory symptoms (SOB or cough) since your procedure? no  3.   Have you tested positive for COVID 19 since your procedure no  4.   Have you had any family members/close contacts diagnosed with the COVID 19 since your procedure?  no   If yes to any of these questions please route to Joylene John, RN and Erenest Rasher, RN

## 2019-08-18 ENCOUNTER — Telehealth: Payer: Self-pay | Admitting: Gastroenterology

## 2019-08-18 NOTE — Telephone Encounter (Signed)
Patient calling Daniel Knox path results and inquiring about a bill

## 2019-08-18 NOTE — Telephone Encounter (Signed)
Linda for the H pylori I recommend the following treatment regimen for 14 days (assuming no antibiotic allergies): Amoxicillin 1gm BID Clarithromycin 500mg  BID Flagyl 500mg  BID Continue his PPI BID   Can you please order this if no signfiicant interactions noted. Once done with therapy, the patient should wait one month and perform a stool study for H pylori antigen to ensure negative. The PPI needs to be held at least 2 weeks prior to submitting the stool sample. The patient should avoid alcohol while taking Flagyl.    [name] will get in touch with him regarding colon pathology and further recommendations for his lower tract symptoms once she is back in the office to review the pathology results. Thanks

## 2019-08-18 NOTE — Telephone Encounter (Signed)
[facility] pt calling for path results. Procedure was done 7/28. Path report mentions positive H pylori. [name] is out of the office until 8/17, please advise as DOD.

## 2019-08-21 ENCOUNTER — Other Ambulatory Visit: Payer: Self-pay

## 2019-08-21 DIAGNOSIS — B9681 Helicobacter pylori [H. pylori] as the cause of diseases classified elsewhere: Secondary | ICD-10-CM

## 2019-08-21 MED ORDER — CLARITHROMYCIN 500 MG PO TABS
500.0000 mg | ORAL_TABLET | Freq: Two times a day (BID) | ORAL | 0 refills | Status: DC
Start: 2019-08-21 — End: 2019-11-17

## 2019-08-21 MED ORDER — METRONIDAZOLE 500 MG PO TABS
500.0000 mg | ORAL_TABLET | Freq: Two times a day (BID) | ORAL | 0 refills | Status: DC
Start: 2019-08-21 — End: 2019-11-17

## 2019-08-21 MED ORDER — AMOXICILLIN 500 MG PO CAPS
1000.0000 mg | ORAL_CAPSULE | Freq: Two times a day (BID) | ORAL | 0 refills | Status: DC
Start: 2019-08-21 — End: 2019-11-17

## 2019-08-21 NOTE — Telephone Encounter (Signed)
Spoke with pt and he is aware and know he will get more information regarding path upon [name] return. Scripts sent to pharmacy, reminder and order in epic for stool specimen.

## 2019-09-06 ENCOUNTER — Telehealth: Payer: Self-pay | Admitting: Gastroenterology

## 2019-09-06 NOTE — Telephone Encounter (Signed)
Pt called to inform that he finished antibiotic for H-Pylori last night. He was told that he needs to schedule a stool test after so he would like to schedule it. Pls call him.

## 2019-09-06 NOTE — Telephone Encounter (Signed)
Spoke with pt, see additional phone note.

## 2019-09-06 NOTE — Telephone Encounter (Signed)
Spoke with pt and let him know he will need to wait 1 month and be off of PPI for 2 weeks prior to the test. Pt aware.

## 2019-09-07 ENCOUNTER — Encounter: Payer: Self-pay | Admitting: Gastroenterology

## 2019-09-08 ENCOUNTER — Telehealth: Payer: Self-pay

## 2019-09-08 NOTE — Telephone Encounter (Signed)
Please see path letter for details. He will need H pylori testing as previously ordered by Dr. Havery Moros. Thank you.

## 2019-09-08 NOTE — Telephone Encounter (Signed)
Letter mailed to pt.  

## 2019-09-08 NOTE — Telephone Encounter (Signed)
Pt called to let us know he has finished his h pylori treatment. Pt wanting to know colon results and when he may need another one. Please advise.

## 2019-09-25 ENCOUNTER — Ambulatory Visit (INDEPENDENT_AMBULATORY_CARE_PROVIDER_SITE_OTHER): Payer: BC Managed Care – PPO | Admitting: Allergy

## 2019-09-25 ENCOUNTER — Other Ambulatory Visit: Payer: Self-pay

## 2019-09-25 ENCOUNTER — Encounter: Payer: Self-pay | Admitting: Allergy

## 2019-09-25 VITALS — BP 118/80 | HR 59 | Temp 98.0°F | Resp 16 | Ht 68.0 in | Wt 163.6 lb

## 2019-09-25 DIAGNOSIS — H1013 Acute atopic conjunctivitis, bilateral: Secondary | ICD-10-CM

## 2019-09-25 DIAGNOSIS — T781XXD Other adverse food reactions, not elsewhere classified, subsequent encounter: Secondary | ICD-10-CM

## 2019-09-25 DIAGNOSIS — J3089 Other allergic rhinitis: Secondary | ICD-10-CM | POA: Diagnosis not present

## 2019-09-25 DIAGNOSIS — T781XXA Other adverse food reactions, not elsewhere classified, initial encounter: Secondary | ICD-10-CM | POA: Insufficient documentation

## 2019-09-25 NOTE — Assessment & Plan Note (Signed)
   See assessment and plan as above for allergic rhinitis.  

## 2019-09-25 NOTE — Patient Instructions (Addendum)
Today's skin testing showed: Positive to grass, weed, ragweed, trees, cat, tobacco and mold. Borderline positive to: orange, cantaloupe, watermelon.  Results given.   Food:   Wait until you are done with your H. Pylori test before introducing fresh fruits back into your diet.  Sometimes washing it and peeling it can help.    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 This is caused by cross reactivity of pollen with fresh fruits and vegetables, and nuts. Symptoms are usually localized in the form of itching and burning in mouth and throat. Very rarely it can progress to more severe symptoms. Eating foods in cooked or processed forms usually minimizes symptoms. I recommended avoidance of eating the problem foods, especially during the peak season(s). Sometimes, OFAS can induce severe throat swelling or even a systemic reaction; with such instance, I advised them to report to a local ER. A list of common pollens and food cross-reactivities was provided to the patient.   Environmental allergies  Start environmental control measures as below.  May use over the counter antihistamines such as Zyrtec (cetirizine), Claritin (loratadine), Allegra (fexofenadine), or Xyzal (levocetirizine) daily as needed. May take twice a day if needed.  Had a detailed discussion with patient/family that clinical history is suggestive of allergic rhinitis, and may benefit from allergy immunotherapy (AIT). Discussed in detail regarding the dosing, schedule, side effects (mild to moderate local allergic reaction and rarely systemic allergic reactions including anaphylaxis), and benefits (significant improvement in nasal symptoms, seasonal flares of asthma) of immunotherapy with the patient. There is significant time commitment involved with allergy shots, which includes weekly immunotherapy injections for first 9-12 months and then biweekly to monthly  injections for 3-5 years.   Read about allergy injections.   Follow up in 6 months or sooner if needed.   Reducing Pollen Exposure . Pollen seasons: trees (spring), grass (summer) and ragweed/weeds (fall). Marland Kitchen Keep windows closed in your home and car to lower pollen exposure.  [name] air conditioning in the bedroom and throughout the house if possible.  . Avoid going out in dry windy days - especially early morning. . Pollen counts are highest between 5 - 10 AM and on dry, hot and windy days.  . Save outside activities for late afternoon or after a heavy rain, when pollen levels are lower.  . Avoid mowing of grass if you have grass pollen allergy. Marland Kitchen Be aware that pollen can also be transported indoors on people and pets.  . Dry your clothes in an automatic dryer rather than hanging them outside where they might collect pollen.  . Rinse hair and eyes before bedtime. Pet Allergen Avoidance: . Contrary to popular opinion, there are no "hypoallergenic" breeds of dogs or cats. That is because people are not allergic to an animal's hair, but to an allergen found in the animal's saliva, dander (dead skin flakes) or urine. Pet allergy symptoms typically occur within minutes. For some people, symptoms can build up and become most severe 8 to 12 hours after contact with the animal. People with severe allergies can experience reactions in public places if dander has been transported on the pet owners' clothing. Marland Kitchen Keeping an animal outdoors is only a partial solution, since homes with pets in the yard still have higher concentrations of animal allergens. . Before getting a pet, ask your allergist to determine if you are allergic to animals. If your pet is already considered part of  your family, try to minimize contact and keep the pet out of the bedroom and other rooms where you spend a great deal of time. . As with dust mites, vacuum carpets often or replace carpet with a hardwood floor, tile or  linoleum. . High-efficiency particulate air (HEPA) cleaners can reduce allergen levels over time. . While dander and saliva are the source of cat and dog allergens, urine is the source of allergens from rabbits, hamsters, mice and [place] pigs; so ask a non-allergic family member to clean the animal's cage. . If you have a pet allergy, talk to your allergist about the potential for allergy immunotherapy (allergy shots). This strategy can often provide long-term relief. Mold Control . Mold and fungi can grow on a variety of surfaces provided certain temperature and moisture conditions exist.  . Outdoor molds grow on plants, decaying vegetation and soil. The major outdoor mold, Alternaria and Cladosporium, are found in very high numbers during hot and dry conditions. Generally, a late summer - fall peak is seen for common outdoor fungal spores. Rain will temporarily lower outdoor mold spore count, but counts rise rapidly when the rainy period ends. . The most important indoor molds are Aspergillus and Penicillium. Dark, humid and poorly ventilated basements are ideal sites for mold growth. The next most common sites of mold growth are the bathroom and the kitchen. Outdoor (Seasonal) Mold Control . Use air conditioning and keep windows closed. . Avoid exposure to decaying vegetation. Marland Kitchen Avoid leaf raking. . Avoid grain handling. . Consider wearing a face mask if working in moldy areas.  Indoor (Perennial) Mold Control  . Maintain humidity below 50%. . Get rid of mold growth on hard surfaces with water, detergent and, if necessary, 5% bleach (do not mix with other cleaners). Then dry the area completely. If mold covers an area more than 10 square feet, consider hiring an indoor environmental professional. . For clothing, washing with soap and water is best. If moldy items cannot be cleaned and dried, throw them away. . Remove sources e.g. contaminated carpets. . Repair and seal leaking roofs or pipes.  Using dehumidifiers in damp basements may be helpful, but empty the water and clean units regularly to prevent mildew from forming. All rooms, especially basements, bathrooms and kitchens, require ventilation and cleaning to deter mold and mildew growth. Avoid carpeting on concrete or damp floors, and storing items in damp areas.

## 2019-09-25 NOTE — Assessment & Plan Note (Signed)
Rhinoconjunctivitis symptoms mainly in the fall and spring for many years.  Tried over-the-counter antihistamines with some benefit.  No previous allergy injections.  Skin testing as a child showed multiple positives per patient report.  Today's skin testing showed: Positive to grass, weed, ragweed, trees, cat, tobacco and mold.  Start environmental control measures as below.  May use over the counter antihistamines such as Zyrtec (cetirizine), Claritin (loratadine), Allegra (fexofenadine), or Xyzal (levocetirizine) daily as needed. May take twice a day if needed.  Had a detailed discussion with patient/family that clinical history is suggestive of allergic rhinitis, and may benefit from allergy immunotherapy (AIT). Discussed in detail regarding the dosing, schedule, side effects (mild to moderate local allergic reaction and rarely systemic allergic reactions including anaphylaxis), and benefits (significant improvement in nasal symptoms, seasonal flares of asthma) of immunotherapy with the patient. There is significant time commitment involved with allergy shots, which includes weekly immunotherapy injections for first 9-12 months and then biweekly to monthly injections for 3-5 years.   Read about allergy injections - handout given.

## 2019-09-25 NOTE — Progress Notes (Signed)
New Patient Note  RE: Daniel Knox MRN: [id number] DOB: Mar 04, 1990 Date of Office Visit: 09/25/2019  Referring provider: Lesleigh Noe, MD Primary care provider: Lesleigh Noe, MD  Chief Complaint: Food Intolerance (fresh fruit)  History of Present Illness: I had the pleasure of seeing Daniel Knox for initial evaluation at the [facility] on 09/25/2019. He is a 29 y.o. male, who is referred here by Lesleigh Noe, MD for the evaluation of food allergy.  Patient stopped eating fresh fruits about 10 years ago as it caused acid reflux and abdominal pains. He is not sure which fresh fruits caused this but it would happen within 10 minutes of ingestion. He would only eat a few bites before symptoms onset. Symptoms lasted for less than 1 hour with no medications. No other associated symptoms.   Past work up includes: none.  Dietary History: patient has been eating other foods including milk, eggs, peanut, treenuts, sesame, shellfish, seafood, soy, wheat, meats, processed fruits and some vegetables. Tolerates fresh vegetables with no issues.  Patient was diagnosed with H. Pylori recently and had work up done by GI. Treated with antibiotics.   Assessment and Plan: [name] is a 29 y.o. male with: Adverse food reaction Patient concerned about food allergies to fresh fruits as 10 years ago he developed acid reflux and abdominal pains after ingestion.  This resolved within an hour with no medication.  Tolerates processed foods and fresh vegetables with no issues.  Recently diagnosed with H. pylori and treated with antibiotics.  Patient does have significant environmental allergies.  Today's skin testing showed: Positive to grass, weed, ragweed, trees, cat, tobacco and mold. Borderline positive to: orange, cantaloupe, watermelon.   Discussed with patient that IgE mediated food allergies (which is what skin testing checks for) do not typically cause acid reflux  symptoms.  Wait until done with your H. Pylori test before introducing fresh fruits back into diet.  There is some concern for possible oral allergy syndrome given his multiple pollen allergies.  This is caused by cross reactivity of pollen with fresh fruits and vegetables, and nuts. Symptoms are usually localized in the form of itching and burning in mouth and throat. Very rarely it can progress to more severe symptoms. Eating foods in cooked or processed forms usually minimizes symptoms. I recommended avoidance of eating the problem foods, especially during the peak season(s). Sometimes, OFAS can induce severe throat swelling or even a systemic reaction; with such instance, I advised them to report to a local ER. A list of common pollens and food cross-reactivities was provided to the patient.   Other allergic rhinitis Rhinoconjunctivitis symptoms mainly in the fall and spring for many years.  Tried over-the-counter antihistamines with some benefit.  No previous allergy injections.  Skin testing as a child showed multiple positives per patient report.  Today's skin testing showed: Positive to grass, weed, ragweed, trees, cat, tobacco and mold.  Start environmental control measures as below.  May use over the counter antihistamines such as Zyrtec (cetirizine), Claritin (loratadine), Allegra (fexofenadine), or Xyzal (levocetirizine) daily as needed. May take twice a day if needed.  Had a detailed discussion with patient/family that clinical history is suggestive of allergic rhinitis, and may benefit from allergy immunotherapy (AIT). Discussed in detail regarding the dosing, schedule, side effects (mild to moderate local allergic reaction and rarely systemic allergic reactions including anaphylaxis), and benefits (significant improvement in nasal symptoms, seasonal flares of asthma) of immunotherapy with the  patient. There is significant time commitment involved with allergy shots, which includes  weekly immunotherapy injections for first 9-12 months and then biweekly to monthly injections for 3-5 years.   Read about allergy injections - handout given.  Allergic conjunctivitis of both eyes  See assessment and plan as above for allergic rhinitis  Return in about 6 months (around 03/24/2020).  Other allergy screening: Asthma: no  Rhino conjunctivitis: yes  He reports symptoms of rash, rhinorrhea, watery/itchy eyes, sneezing, nasal congestion. Symptoms have been going on for many years. The symptoms are present mainly during the fall and spring. Anosmia: no. Headache: yes at times. He has used zyrtec, Claritin, Allegra with some improvement in symptoms. Sinus infections: no. Previous work up includes: skin testing as a child was positive to grass, weed, ragweed per patient report. No allergy injections in the past.  Previous ENT evaluation: no. Previous sinus imaging: no. History of nasal polyps: no. Last eye exam: this year History of reflux: patient was on Protonix  Medication allergy: no Hymenoptera allergy: no Urticaria: no Eczema:no History of recurrent infections suggestive of immunodeficency: no  Diagnostics: Skin Testing: Environmental allergy panel and select foods. Positive to grass, weed, ragweed, trees, cat, tobacco and mold. Borderline positive to: orange, cantaloupe, watermelon.  Results discussed with patient/family.  Airborne Adult Perc - 09/25/19 1011    Time Antigen Placed 1011    Allergen Manufacturer Greer    Location Back    Number of Test 59    Panel 1 Select    1. Control-Buffer 50% Glycerol Negative    2. Control-Histamine 1 mg/ml 2+    3. Albumin saline Negative    4. [place] 4+    5. [place] 4+    6. [place] 4+    7. [place] 4+    8. Meadow Fescue 3+    9. Perennial Rye 4+    10. Sweet Vernal 4+    11. Timothy 4+    12. Cocklebur 4+    13. Burweed Marshelder 4+    14. Ragweed, short 4+    15. Ragweed, Giant 4+    16. Plantain,   English 4+    17. Lamb's Quarters 2+    18. Sheep Sorrell 2+    19. Rough Pigweed 4+    20. Marsh Elder, Rough 2+    21. Mugwort, Common 4+    22. Ash mix 3+    23. Birch mix 4+    24. Beech American 4+    25. Box, Elder 4+    26. Cedar, red 2+    27. Cottonwood, Eastern 4+    28. Elm mix 4+    29. Hickory 4+    30. Maple mix 2+    31. Oak, [place] mix 4+    32. Pecan Pollen 3+    33. Pine mix 2+    34. Sycamore Eastern 4+    35. [place], Black Pollen 4+    36. Alternaria alternata Negative    37. Cladosporium Herbarum Negative    38. Aspergillus mix Negative    39. Penicillium mix Negative    41. Drechslera spicifera (Curvularia) Negative    42. Mucor plumbeus Negative    43. Fusarium moniliforme Negative    44. Aureobasidium pullulans (pullulara) Negative    45. Rhizopus oryzae Negative    46. Botrytis cinera Negative    47. Epicoccum nigrum Negative    48. Phoma betae Negative    49. Candida Albicans Negative  50. Trichophyton mentagrophytes Negative    51. Mite, D Farinae  5,000 AU/ml Negative    52. Mite, D Pteronyssinus  5,000 AU/ml Negative    53. Cat Hair 10,000 BAU/ml 4+    54.  Dog Epithelia Negative    55. Mixed Feathers Negative    56. Horse Epithelia Negative    57. Cockroach, German Negative    58. Mouse Negative    59. Tobacco Leaf 2+          Intradermal - 09/25/19 1115    Time Antigen Placed 1100    Allergen Manufacturer Lavella Hammock    Location Arm    Number of Test 8    Intradermal Select    Control Negative    Mold 1 Negative    Mold 2 2+    Mold 3 3+    Mold 4 3+    Dog Negative    Cockroach Negative    Mite mix Negative          Food Adult Perc - 09/25/19 1000    Time Antigen Placed [place]    Location Back    Number of allergen test 9    55. Grape (White seedless) Negative    56. Orange  --   3x3   57. Banana Negative    58. Apple Negative    59. Peach Negative    60. Strawberry Negative    61.  Cantaloupe --   2x2   62. Watermelon --   2x2   63. Pineapple Negative           Past Medical History: Patient Active Problem List   Diagnosis Date Noted  . Other allergic rhinitis 09/25/2019  . Adverse food reaction 09/25/2019  . Allergic conjunctivitis of both eyes 09/25/2019  . 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 Diarrhea 06/26/2019  . 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 Solitary kidney 06/22/2019  . Bladder mass 06/22/2019   Past Medical History:  Diagnosis Date  . Allergies   . Clotting disorder ([place])   . Factor V deficiency ([place])   . Kidney congenitally absent, left    Past Surgical History: Past Surgical History:  Procedure Laterality Date  . EYE SURGERY     lazy eye  . LEG SURGERY     broken leg   Medication List:  Current Outpatient Medications  Medication Sig Dispense Refill  . amoxicillin (AMOXIL) 500 MG capsule Take 2 capsules (1,000 mg total) by mouth 2 (two) times daily. (Patient not taking: Reported on 09/25/2019) 56 capsule 0  . clarithromycin (BIAXIN) 500 MG tablet Take 1 tablet (500 mg total) by mouth 2 (two) times daily. (Patient not taking: Reported on 09/25/2019) 28 tablet 0  . metroNIDAZOLE (FLAGYL) 500 MG tablet Take 1 tablet (500 mg total) by mouth 2 (two) times daily. (Patient not taking: Reported on 09/25/2019) 28 tablet 0  . pantoprazole (PROTONIX) 40 MG tablet Take 1 tablet (40 mg total) by mouth 2 (two) times daily. (Patient not taking: Reported on 09/25/2019) 90 tablet 2  . Psyllium (METAMUCIL PO) Take by mouth. (Patient not taking: Reported on 09/25/2019)     No current facility-administered medications for this visit.   Allergies: No Known Allergies Social History: Social History   Socioeconomic History  . Marital status: Married    Spouse name: Myna Bright  . Number of children: 0  . Years of education: Some college  . Highest education level: Not on file  Occupational History  . Occupation: cdl driver   Tobacco Use  . Smoking  status: Never Smoker  . Smokeless tobacco: Current User    Types: Chew  Substance and Sexual Activity  . Alcohol use: Yes    Comment: a few times a week, 1-4 depending on the day  . Drug use: No  . Sexual activity: Yes    Birth control/protection: I.U.D.    Comment: Married  Other Topics Concern  . Not on file  Social History Narrative   06/26/19   From: the area   Living: with wife, Myna Bright (2018)   Work: Drives a Engineer, maintenance       Family: parents nearby, good relationship      Enjoys: working on housing, work keeps him busy, play golf      Exercise: golf   Diet: varied, limits leafy greens      Safety   Seat belts: Yes    Guns: Yes  and secure    Safe in relationships: Yes    Social Determinants of Radio broadcast assistant Strain:   . Difficulty of Paying Living Expenses: Not on file  Food Insecurity:   . Worried About Charity fundraiser in the Last Year: Not on file  . Ran Out of Food in the Last Year: Not on file  Transportation Needs:   . Lack of Transportation (Medical): Not on file  . Lack of Transportation (Non-Medical): Not on file  Physical Activity:   . Days of Exercise per Week: Not on file  . Minutes of Exercise per Session: Not on file  Stress:   . Feeling of Stress : Not on file  Social Connections:   . Frequency of Communication with Friends and Family: Not on file  . Frequency of Social Gatherings with Friends and Family: Not on file  . Attends Religious Services: Not on file  . Active Member of Clubs or Organizations: Not on file  . Attends Archivist Meetings: Not on file  . Marital Status: Not on file   Lives in a 27 year old home. Smoking: denies Occupation: Careers adviser HistoryFreight forwarder in the house: no Carpet in the family room: no Carpet in the bedroom: no Heating: electric Cooling: central Pet: yes 2 dogs x 6 yrs, 3 yrs  Family History: Family History  Problem Relation Age of Onset  .  Healthy Mother   . Factor V Leiden deficiency Father   . Factor V Leiden deficiency Paternal Grandmother   . Cancer Neg Hx   . Heart failure Neg Hx   . Diabetes Neg Hx   . Hyperlipidemia Neg Hx   . Colon cancer Neg Hx   . Colon polyps Neg Hx   . Stomach cancer Neg Hx   . Esophageal cancer Neg Hx    Problem                               Relation Asthma                                   No  Eczema                                No  Food allergy  No  Allergic rhino conjunctivitis     No   Review of Systems  Constitutional: Positive for appetite change. Negative for chills and fever.  HENT: Positive for congestion and sneezing. Negative for rhinorrhea.   Eyes: Negative for itching.  Respiratory: Negative for cough, chest tightness, shortness of breath and wheezing.   Cardiovascular: Negative for chest pain.  Gastrointestinal: Positive for abdominal pain.  Genitourinary: Negative for difficulty urinating.  Skin: Negative for rash.  Allergic/Immunologic: Positive for environmental allergies.  Neurological: Negative for headaches.   Objective: BP 118/80   Pulse (!) 59   Temp 98 F (36.7 C) (Temporal)   Resp 16   Ht 5\' 8"  (1.727 m)   Wt 163 lb 9.6 oz (74.2 kg)   BMI 24.88 kg/m  Body mass index is 24.88 kg/m. Physical Exam Vitals and nursing note reviewed.  Constitutional:      Appearance: Normal appearance. He is well-developed.  HENT:     Head: Normocephalic and atraumatic.     Right Ear: Tympanic membrane and external ear normal.     Left Ear: Tympanic membrane and external ear normal.     Nose: Nose normal.     Mouth/Throat:     Mouth: Mucous membranes are moist.     Pharynx: Oropharynx is clear.  Eyes:     Conjunctiva/sclera: Conjunctivae normal.  Cardiovascular:     Rate and Rhythm: Normal rate and regular rhythm.     Heart sounds: Normal heart sounds. No murmur heard.  No friction rub. No gallop.   Pulmonary:     Effort: Pulmonary  effort is normal.     Breath sounds: Normal breath sounds. No wheezing, rhonchi or rales.  Musculoskeletal:     Cervical back: Neck supple.  Skin:    General: Skin is warm.     Findings: No rash.  Neurological:     Mental Status: He is alert and oriented to person, place, and time.  Psychiatric:        Behavior: Behavior normal.    The plan was reviewed with the patient/family, and all questions/concerned were addressed.  It was my pleasure to see Daniel Knox today and participate in his care. Please feel free to contact me with any questions or concerns.  Sincerely,  Rexene Alberts, DO Allergy & Immunology  [facility] [place] office: [phone] [facility] office: [place] office: [phone]

## 2019-09-25 NOTE — Assessment & Plan Note (Addendum)
Patient concerned about food allergies to fresh fruits as 10 years ago he developed acid reflux and abdominal pains after ingestion.  This resolved within an hour with no medication.  Tolerates processed foods and fresh vegetables with no issues.  Recently diagnosed with H. pylori and treated with antibiotics.  Patient does have significant environmental allergies.  Today's skin testing showed: Positive to grass, weed, ragweed, trees, cat, tobacco and mold. Borderline positive to: orange, cantaloupe, watermelon.   Discussed with patient that IgE mediated food allergies (which is what skin testing checks for) do not typically cause acid reflux symptoms.  Wait until done with your H. Pylori test before introducing fresh fruits back into diet.  There is some concern for possible oral allergy syndrome given his multiple pollen allergies.  This is caused by cross reactivity of pollen with fresh fruits and vegetables, and nuts. Symptoms are usually localized in the form of itching and burning in mouth and throat. Very rarely it can progress to more severe symptoms. Eating foods in cooked or processed forms usually minimizes symptoms. I recommended avoidance of eating the problem foods, especially during the peak season(s). Sometimes, OFAS can induce severe throat swelling or even a systemic reaction; with such instance, I advised them to report to a local ER. A list of common pollens and food cross-reactivities was provided to the patient.

## 2019-10-03 ENCOUNTER — Other Ambulatory Visit: Payer: BC Managed Care – PPO

## 2019-10-03 DIAGNOSIS — B9681 Helicobacter pylori [H. pylori] as the cause of diseases classified elsewhere: Secondary | ICD-10-CM

## 2019-10-04 DIAGNOSIS — K2970 Gastritis, unspecified, without bleeding: Secondary | ICD-10-CM | POA: Diagnosis not present

## 2019-10-04 DIAGNOSIS — B9681 Helicobacter pylori [H. pylori] as the cause of diseases classified elsewhere: Secondary | ICD-10-CM | POA: Diagnosis not present

## 2019-10-05 LAB — HELICOBACTER PYLORI  SPECIAL ANTIGEN
MICRO NUMBER:: 10982282
SPECIMEN QUALITY: ADEQUATE

## 2019-10-18 ENCOUNTER — Other Ambulatory Visit: Payer: Self-pay

## 2019-10-18 ENCOUNTER — Ambulatory Visit: Payer: BC Managed Care – PPO | Admitting: Urology

## 2019-10-18 DIAGNOSIS — N3289 Other specified disorders of bladder: Secondary | ICD-10-CM

## 2019-10-23 ENCOUNTER — Other Ambulatory Visit: Payer: Self-pay

## 2019-10-23 ENCOUNTER — Telehealth: Payer: Self-pay | Admitting: Urology

## 2019-10-23 DIAGNOSIS — N3289 Other specified disorders of bladder: Secondary | ICD-10-CM

## 2019-10-23 NOTE — Telephone Encounter (Signed)
imaging called and said orders need to be corrected to either RUS looking at Kidneys and bladder or US pelvis limited that looks at just the bladder, pt anxious to get this scheduled - any questions [phone] technologists

## 2019-10-24 NOTE — Telephone Encounter (Signed)
Can you correct this to US pelvis please? Thanks. Reason is to eval change in bladder mass  [name], MD 10/24/2019

## 2019-10-24 NOTE — Telephone Encounter (Signed)
Order was corrected 10/23/19. Thanks.

## 2019-10-27 ENCOUNTER — Other Ambulatory Visit: Payer: Self-pay

## 2019-10-27 ENCOUNTER — Ambulatory Visit
Admission: RE | Admit: 2019-10-27 | Discharge: 2019-10-27 | Disposition: A | Discharge status: Home / Self Care | Payer: BC Managed Care – PPO | Source: Ambulatory Visit | Attending: Urology | Admitting: Urology

## 2019-10-27 DIAGNOSIS — R1909 Other intra-abdominal and pelvic swelling, mass and lump: Secondary | ICD-10-CM | POA: Diagnosis not present

## 2019-10-27 DIAGNOSIS — N3289 Other specified disorders of bladder: Secondary | ICD-10-CM | POA: Insufficient documentation

## 2019-11-02 ENCOUNTER — Other Ambulatory Visit: Payer: Self-pay

## 2019-11-02 ENCOUNTER — Telehealth (INDEPENDENT_AMBULATORY_CARE_PROVIDER_SITE_OTHER): Payer: BC Managed Care – PPO | Admitting: Urology

## 2019-11-02 DIAGNOSIS — N3289 Other specified disorders of bladder: Secondary | ICD-10-CM | POA: Diagnosis not present

## 2019-11-02 DIAGNOSIS — Q600 Renal agenesis, unilateral: Secondary | ICD-10-CM | POA: Diagnosis not present

## 2019-11-02 NOTE — Progress Notes (Signed)
Virtual Visit via Telephone Note  I connected with Daniel Knox on 11/02/19 at  1:00 PM EDT by telephone and verified that I am speaking with the correct person using two identifiers.   Patient location: Home Provider location: [facility] Office   I discussed the limitations, risks, security and privacy concerns of performing an evaluation and management service by telephone and the availability of in person appointments. We discussed the impact of the COVID-19 pandemic on the healthcare system, and the importance of social distancing and reducing patient and provider exposure. I also discussed with the patient that there may be a patient responsible charge related to this service. The patient expressed understanding and agreed to proceed.  Reason for visit: Bladder mass, solitary kidney  History of Present Illness: I had phone follow-up with Daniel Knox today regarding his body mass.  Briefly he is a healthy 29 year old male who had a CT scan performed in urgent care in May 2021 for right-sided lower abdominal pain.  This showed no etiology of his pain, however there was a 4 cm mass within the bladder at the vicinity of the left UVJ with a dilated left proximal ureter and completely atrophic left kidney.  Renal function is normal.  He underwent a cystoscopy with me in clinic in June 2021 that showed a 4 cm bladder mass that was bullous in appearance with no papillary changes, cytology was sent and was negative.  I had previously recommended TURBT for diagnosis, and he refused.  We discussed the possibility of undiagnosed malignancy and development of metastatic disease.  We also discussed that there is a high probability that the mass would be benign in the setting of his absent kidney on that side and that this may be related to an obstructing ureterocele.  He opted for 3 months follow-up with a bladder ultrasound.  This redemonstrated the complex cystic mass in the left side of the  bladder corresponding to prior CT and favored a ureterocele with debris, and less likely a solid malignancy.  We again had a long conversation today about options including TURBT for definitive diagnosis, MRI for better evaluation while avoiding surgery, or repeat bladder ultrasound in 1 year.  He would like to discuss with his wife prior to making a decision.  We again discussed the risk of missing a clinically significant malignancy by deferring TURBT/biopsy or MRI.  He understands these risk.  Follow Up: He will discuss with his wife and let us know how he would like to proceed Follow-up scheduled for 6 months to avoid being lost to follow-up   I discussed the assessment and treatment plan with the patient. The patient was provided an opportunity to ask questions and all were answered. The patient agreed with the plan and demonstrated an understanding of the instructions.   The patient was advised to call back or seek an in-person evaluation if the symptoms worsen or if the condition fails to improve as anticipated.  I provided 13 minutes of non-face-to-face time during this encounter.   Billey Co, MD

## 2019-11-17 ENCOUNTER — Ambulatory Visit: Payer: BC Managed Care – PPO | Admitting: Gastroenterology

## 2019-11-17 ENCOUNTER — Encounter: Payer: Self-pay | Admitting: Gastroenterology

## 2019-11-17 VITALS — BP 122/76 | HR 80 | Ht 70.0 in | Wt 170.0 lb

## 2019-11-17 DIAGNOSIS — R194 Change in bowel habit: Secondary | ICD-10-CM | POA: Diagnosis not present

## 2019-11-17 NOTE — Patient Instructions (Addendum)
Start Benefiber or Citrucel 2 teaspoons in 8 ounces of liquid daily and may increase to twice daily if tolerated.   Please follow up with [name] or an APP as needed.

## 2019-11-17 NOTE — Progress Notes (Signed)
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  He has been seen and evaluated extensively over the past several months from a GI standpoint.  He has undergone EGD, colonoscopy, CT scan, extensive labs and stool studies.  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  Otherwise terminal ileal biopsies and biopsies throughout the colon were unremarkable.  EGD revealed H. pylori gastritis and he was treated for that.  Subsequent follow-up stool antigen testing was negative.  Otherwise evaluation has been relatively unrevealing.  It was hard to get a clear picture of exactly what his complaint is, but ultimately it seems that he is bothered by stool frequency or the least amount of times that he needs to visit the bathroom with sensation that he needs to have a bowel movement.  He says that sometimes he goes and moves his bowels and other times nothing happens.  He says that it is intermittent.  Sometimes he will have symptoms for a couple of days at a time and sometimes he will not have any symptoms for a few days at a time.  He denies diarrhea.  He denies abdominal pain.  He says that this all started out of the blue about 6 months ago.   Past Medical History:  Diagnosis Date  . Allergies   . Clotting disorder ([place])   . Factor V deficiency ([place])   . Kidney congenitally absent, left    Past Surgical History:  Procedure Laterality Date  . EYE SURGERY     lazy eye  . LEG SURGERY     broken leg    reports that he has never smoked. His smokeless tobacco use includes chew. He reports current alcohol use. He reports that he does not use drugs. family history includes Factor V Leiden deficiency in his father and paternal grandmother; Healthy in his mother. No Known Allergies    Outpatient Encounter Medications as of 11/17/2019  Medication Sig  . pantoprazole (PROTONIX) 40 MG tablet  Take 1 tablet (40 mg total) by mouth 2 (two) times daily. (Patient not taking: Reported on 09/25/2019)  . Psyllium (METAMUCIL PO) Take by mouth. (Patient not taking: Reported on 09/25/2019)  . [DISCONTINUED] amoxicillin (AMOXIL) 500 MG capsule Take 2 capsules (1,000 mg total) by mouth 2 (two) times daily. (Patient not taking: Reported on 09/25/2019)  . [DISCONTINUED] clarithromycin (BIAXIN) 500 MG tablet Take 1 tablet (500 mg total) by mouth 2 (two) times daily. (Patient not taking: Reported on 09/25/2019)  . [DISCONTINUED] metroNIDAZOLE (FLAGYL) 500 MG tablet Take 1 tablet (500 mg total) by mouth 2 (two) times daily. (Patient not taking: Reported on 09/25/2019)   No facility-administered encounter medications on file as of 11/17/2019.     REVIEW OF SYSTEMS  : All other systems reviewed and negative except where noted in the History of Present Illness.   PHYSICAL EXAM: BP 122/76   Pulse 80   Ht 5\' 10"  (1.778 m)   Wt 170 lb (77.1 kg)   BMI 24.39 kg/m  General: Well developed white male in no acute distress Head: Normocephalic and atraumatic Eyes:  Sclerae anicteric, conjunctiva pink. Ears: Normal auditory acuity Lungs: Clear throughout to auscultation; no W/R/R. Heart: Regular rate and rhythm; no M/R/G. Abdomen: Soft, non-distended.  BS present.  Non-tender. Musculoskeletal: Symmetrical with no gross deformities  Skin: No lesions on visible extremities Extremities: No edema  Neurological: Alert  oriented x 4, grossly non-focal Psychological:  Alert and cooperative. Normal mood and affect  ASSESSMENT AND PLAN: *29 year old male with complaints of increased stool frequency/sensation that he needs to move his bowels.  He has to visit the bathroom often and sometimes moves his bowels and sometimes not.  He has had extensive GI evaluation without any real contributing issues/diagnoses.  Could possibly be IBS related.  I recommended that he restart powder fiber supplement such as Benefiber or  Citrucel beginning with 2 teaspoons mixed in 8 ounces of liquid daily and increasing to twice daily if tolerated.  He has several food allergies.  We discussed possibility of lactose intolerance.  He is going to try to follow a lactose-free diet for a few weeks and see if this makes a difference.  Literature was given.   CC:  Daniel Noe, MD

## 2019-11-19 NOTE — Progress Notes (Signed)
Reviewed and agree with management plans. ? ?Valen Mascaro L. Huck Ashworth, MD, MPH  ?

## 2019-12-02 DIAGNOSIS — H10013 Acute follicular conjunctivitis, bilateral: Secondary | ICD-10-CM | POA: Diagnosis not present

## 2019-12-04 ENCOUNTER — Other Ambulatory Visit: Payer: Self-pay | Admitting: Family Medicine

## 2019-12-04 DIAGNOSIS — N3289 Other specified disorders of bladder: Secondary | ICD-10-CM

## 2019-12-06 ENCOUNTER — Encounter: Payer: Self-pay | Admitting: Urology

## 2019-12-06 ENCOUNTER — Other Ambulatory Visit: Payer: Self-pay

## 2019-12-06 ENCOUNTER — Ambulatory Visit: Payer: BC Managed Care – PPO | Admitting: Urology

## 2019-12-06 VITALS — BP 130/78 | HR 55 | Ht 70.0 in | Wt 160.0 lb

## 2019-12-06 DIAGNOSIS — N3289 Other specified disorders of bladder: Secondary | ICD-10-CM | POA: Diagnosis not present

## 2019-12-06 NOTE — Patient Instructions (Signed)
Please call [phone] to schedule U/S in October 2022

## 2019-12-06 NOTE — Progress Notes (Signed)
   12/06/2019 4:47 PM   Daniel Knox 1990-02-04 [id number]  Reason for visit: Follow up bladder mass  HPI: I saw [name] back in urology clinic today for follow-up of his bladder mass.  Briefly he is a 29 year old healthy male who had a CT performed in urgent care in May 2021 for right-sided lower abdominal pain.  This showed no etiology of his pain, however there is a 4 cm mass within the bladder at the vicinity of the left UVJ with a completely atrophic left kidney.  We performed follow-up cystoscopy in June 2021 that showed a bullous appearing lesion with no papillary changes, and cytology was sent and negative.  We had previously discussed options including biopsy/TURBT/observation.  He opted for a follow-up bladder ultrasound 3 months later that showed a complex cystic mass in the left side the bladder that most likely represented a ureterocele with internal debris, and less likely to be a solid malignancy.  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  We discussed at length that we cannot completely rule out malignancy without biopsy or TURBT, but that this most likely represents a benign lesion.  They would like to hold off on resection or biopsy at this time which is reasonable.  We discussed the risks at length.  I recommended repeating a bladder ultrasound in 1 year to evaluate for any change.  We also discussed return precautions including weight loss or new urinary symptoms or gross hematuria.  Repeat pelvic ultrasound in 1 year, call with results  Billey Co, MD  [facility] [address], [place], [place] [address] [phone]

## 2019-12-20 ENCOUNTER — Other Ambulatory Visit: Payer: BC Managed Care – PPO

## 2019-12-21 ENCOUNTER — Telehealth: Payer: Self-pay

## 2019-12-21 ENCOUNTER — Ambulatory Visit: Payer: BC Managed Care – PPO | Admitting: Family Medicine

## 2019-12-21 ENCOUNTER — Encounter: Payer: Self-pay | Admitting: Family Medicine

## 2019-12-21 ENCOUNTER — Other Ambulatory Visit: Payer: Self-pay

## 2019-12-21 VITALS — BP 112/70 | HR 46 | Temp 97.8°F | Ht 70.0 in | Wt 172.0 lb

## 2019-12-21 DIAGNOSIS — S299XXA Unspecified injury of thorax, initial encounter: Secondary | ICD-10-CM | POA: Insufficient documentation

## 2019-12-21 MED ORDER — IBUPROFEN 800 MG PO TABS: 800.0000 mg | ORAL_TABLET | Freq: Three times a day (TID) | ORAL | 0 refills | Status: DC | PRN

## 2019-12-21 NOTE — Telephone Encounter (Signed)
Advised pt of msg and to contact office if pain is not controlled. Pt verbalized understanding.

## 2019-12-21 NOTE — Progress Notes (Signed)
  Chief Complaint  Patient presents with  . Fall    Saturday  . Pain in Ribs    History of Present Illness: Fall The accident occurred 3 to 5 days ago. The fall occurred from a ladder (was stepping from steps  to truck bed). He fell from a height of 3 to 5 ft. He landed on hard floor. There was no blood loss. Point of impact: right chest wall. Pain location: none. The pain is severe. The symptoms are aggravated by movement. Pertinent negatives include no headaches, loss of consciousness, nausea or numbness. He has tried acetaminophen for the symptoms. The treatment provided mild relief.    28 year old male patient of Dr. Einar Pheasant presents after accidental fall 5 days ago   Having pain in right chest wall.. pain in anterior chest and lateral base of rib cage. Pain with deep breaths and movement. Pain 6/10 on pain scale.  Treating with tylenol. No ice or heat.  Pain is not improving. No SOB, no cough.   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 .   This visit occurred during the SARS-CoV-2 public health emergency.  Safety protocols were in place, including screening questions prior to the visit, additional usage of staff PPE, and extensive cleaning of exam room while observing appropriate contact time as indicated for disinfecting solutions.   COVID 19 screen:  No recent travel or known exposure to COVID19 The patient denies respiratory symptoms of COVID 19 at this time. The importance of social distancing was discussed today.     Review of Systems  Gastrointestinal: Negative for nausea.  Neurological: Negative for loss of consciousness, numbness and headaches.      Past Medical History:  Diagnosis Date  . Allergies   . Clotting disorder ([place])   . Factor V deficiency ([place])   . Kidney congenitally absent, left     reports that he has never smoked. His smokeless tobacco use includes chew. He reports current alcohol use. He reports that he does not use drugs.  No  current outpatient medications on file.   Observations/Objective: Blood pressure 112/70, pulse (!) 46, temperature 97.8 F (36.6 C), temperature source Temporal, height 5\' 10"  (1.778 m), weight 172 lb (78 kg), SpO2 98 %.  Physical Exam   Assessment and Plan      Eliezer Lofts, MD

## 2019-12-21 NOTE — Telephone Encounter (Signed)
Pt is concerned with taking 800mg  ibuprofen due to only having 1 kidney. He has had some stomach issues recently, also. He is afraid to take it. Asking what Dr Diona Browner thinks.

## 2019-12-21 NOTE — Patient Instructions (Addendum)
Start ice on right chest wall. Start ibuprofen 800 mg three times daily. No lifting,or turning of chest wall. Pain will last longer up to several month if you continue to lift things you should't. Try to take deep breaths to prevent pneumonia.

## 2019-12-21 NOTE — Telephone Encounter (Signed)
Agree... I did not note he had only one kidney... continue tylenol for pain but use extra strength regularly... avoid NSAIDs... if pain not controlled with regular tylenol we can try hydrocodone/APAP.

## 2020-01-27 DIAGNOSIS — Z1152 Encounter for screening for COVID-19: Secondary | ICD-10-CM | POA: Diagnosis not present

## 2020-01-30 DIAGNOSIS — Z1152 Encounter for screening for COVID-19: Secondary | ICD-10-CM | POA: Diagnosis not present

## 2020-02-01 DIAGNOSIS — Z1152 Encounter for screening for COVID-19: Secondary | ICD-10-CM | POA: Diagnosis not present

## 2020-05-08 ENCOUNTER — Other Ambulatory Visit: Payer: Self-pay

## 2020-05-08 ENCOUNTER — Telehealth: Payer: Self-pay

## 2020-05-08 ENCOUNTER — Telehealth: Payer: Self-pay | Admitting: Urology

## 2020-05-08 NOTE — Telephone Encounter (Signed)
Called pt per Dr. Diamantina Providence called pt, no answer. LM informing pt that today's virtual appt was previously scheduled and not actually needed. Appt cancelled. Rescheduled to November w/ pelvic u/s prior. PT advised to call back for questions or concerns.

## 2020-11-21 ENCOUNTER — Ambulatory Visit
Admission: RE | Admit: 2020-11-21 | Discharge: 2020-11-21 | Disposition: A | Discharge status: Home / Self Care | Payer: BC Managed Care – PPO | Source: Ambulatory Visit | Attending: Urology | Admitting: Urology

## 2020-11-21 DIAGNOSIS — N3289 Other specified disorders of bladder: Secondary | ICD-10-CM | POA: Diagnosis present

## 2020-12-02 ENCOUNTER — Telehealth: Payer: Self-pay | Admitting: Urology

## 2020-12-02 NOTE — Telephone Encounter (Signed)
Pt would like for someone to give him a call to go over his results. He is cancelling his 11/30 appt.

## 2020-12-03 NOTE — Telephone Encounter (Signed)
Please have patient scheduled for a telephone visit if he would like to review his results with the provider.

## 2020-12-11 ENCOUNTER — Ambulatory Visit: Payer: Self-pay | Admitting: Urology

## 2020-12-11 ENCOUNTER — Ambulatory Visit (INDEPENDENT_AMBULATORY_CARE_PROVIDER_SITE_OTHER): Payer: BC Managed Care – PPO | Admitting: Urology

## 2020-12-11 ENCOUNTER — Other Ambulatory Visit: Payer: Self-pay

## 2020-12-11 DIAGNOSIS — N2889 Other specified disorders of kidney and ureter: Secondary | ICD-10-CM | POA: Diagnosis not present

## 2020-12-11 NOTE — Progress Notes (Signed)
Virtual Visit via Telephone Note  I connected with Daniel Knox on 12/11/20 at  8:30 AM EST by telephone and verified that I am speaking with the correct person using two identifiers.   Patient location: Home Provider location: [facility]   I discussed the limitations, risks, security and privacy concerns of performing an evaluation and management service by telephone and the availability of in person appointments. We discussed the impact of the COVID-19 pandemic on the healthcare system, and the importance of social distancing and reducing patient and provider exposure. I also discussed with the patient that there may be a patient responsible charge related to this service. The patient expressed understanding and agreed to proceed.  Reason for visit: Left ureterocele, solitary kidney  History of Present Illness: 30 year old healthy male with normal renal function who had some right lower quadrant abdominal pain and CT in May 2021 showed incidental finding of a suspected left ureterocele with completely atrophic left kidney.  Cystoscopy showed a suspected left ureterocele with no evidence of malignancy.  We had discussed biopsy or incision previously, and he opted for observation.  Follow-up pelvic ultrasound 11/21/2020 shows stable left ureterocele with no changes.  We discussed the importance of long-term monitoring of his renal function, as well as avoiding smoking, blood pressure control, and monitoring for diabetes to maintain healthy function of his solitary right kidney.  I do not think he needs yearly pelvic/renal ultrasounds in the setting of a completely atrophic left kidney and stable ureterocele.  We discussed return precautions including UTIs, gross hematuria, urinary symptoms.   Follow Up: Follow-up with urology as needed   I discussed the assessment and treatment plan with the patient. The patient was provided an opportunity to ask questions and all were  answered. The patient agreed with the plan and demonstrated an understanding of the instructions.   The patient was advised to call back or seek an in-person evaluation if the symptoms worsen or if the condition fails to improve as anticipated.  I provided 8 minutes of non-face-to-face time during this encounter.   Billey Co, MD

## 2021-01-14 IMAGING — CT CT ABD-PELV W/ CM
2 of 4 series · 16 of 46 positions shown, 18 images · IV contrast (OMNIPAQUE 300)
Comparison: None.

CLINICAL DATA: Right lower quadrant abdominal pain and cramping

EXAM:
CT ABDOMEN AND PELVIS WITH CONTRAST
TECHNIQUE: Multidetector CT imaging of the abdomen and pelvis was performed
using the standard protocol following bolus administration of
intravenous contrast.
CONTRAST:  100mL OMNIPAQUE IOHEXOL 300 MG/ML SOLN, additional oral
enteric contrast

[Series 2: abd/pel w · axial · 0.68mm/px · z∈[-430,-44]mm · 13 of 85 slices shown, 15 images]
[im 4/85  soft-tissue]
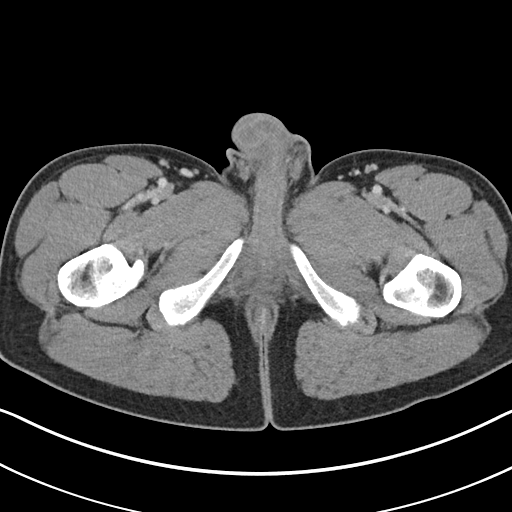
[im 4/85  bone]
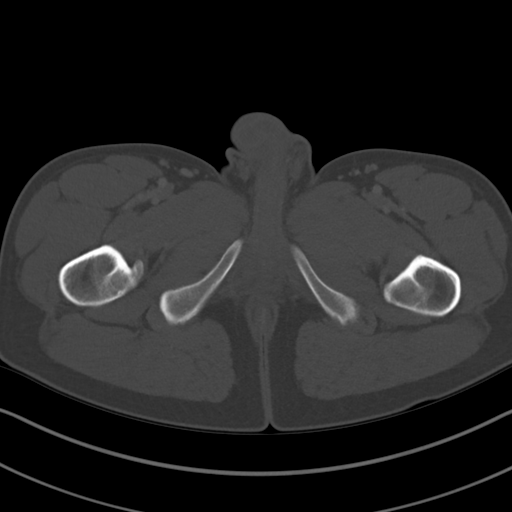
[im 11/85  soft-tissue]
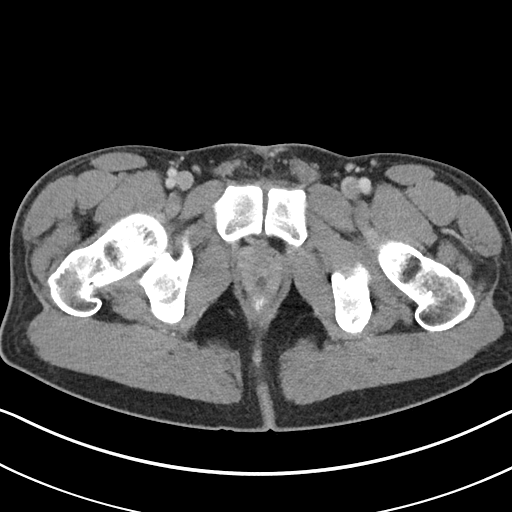
[im 17/85  soft-tissue]
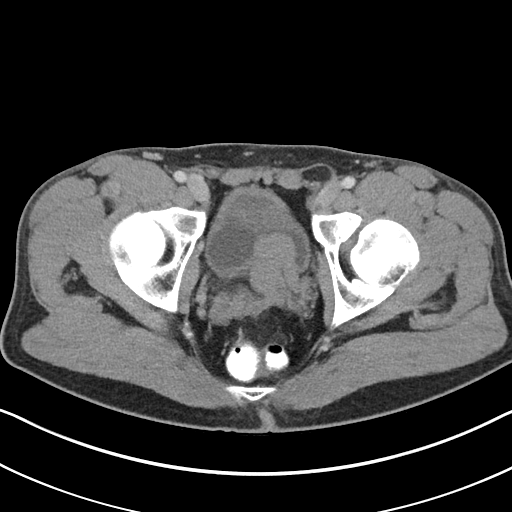
[im 24/85  soft-tissue]
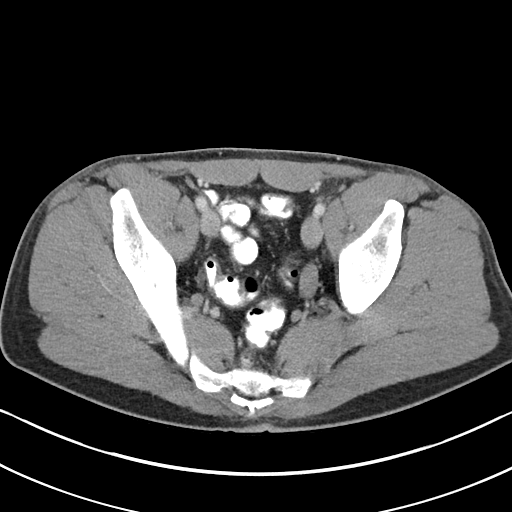
[im 31/85  soft-tissue]
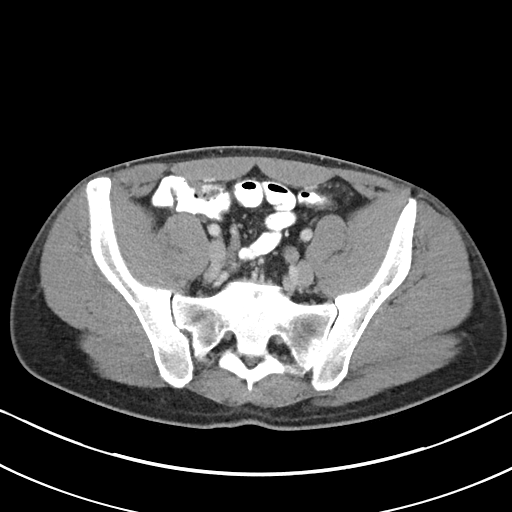
[im 37/85  soft-tissue]
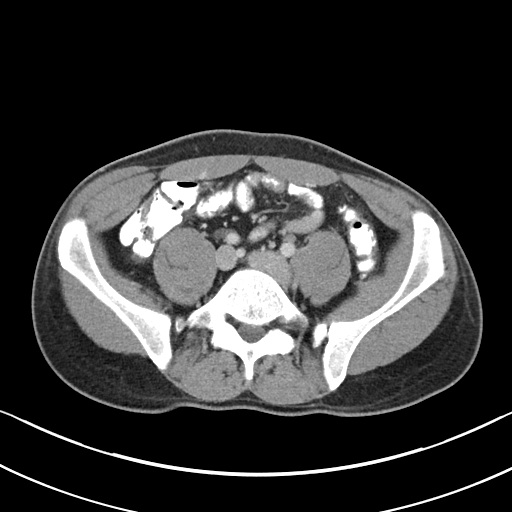
[im 44/85  soft-tissue]
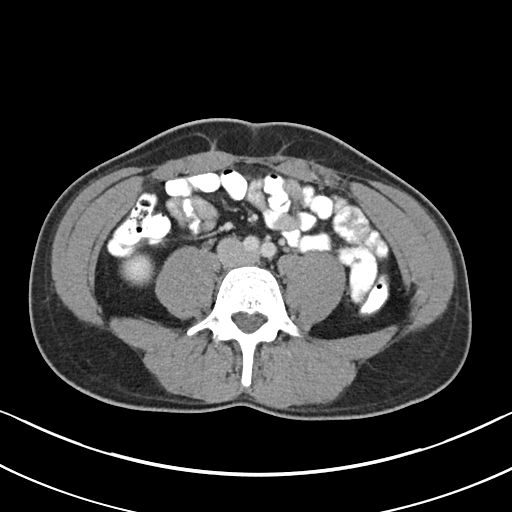
[im 48/85  soft-tissue]
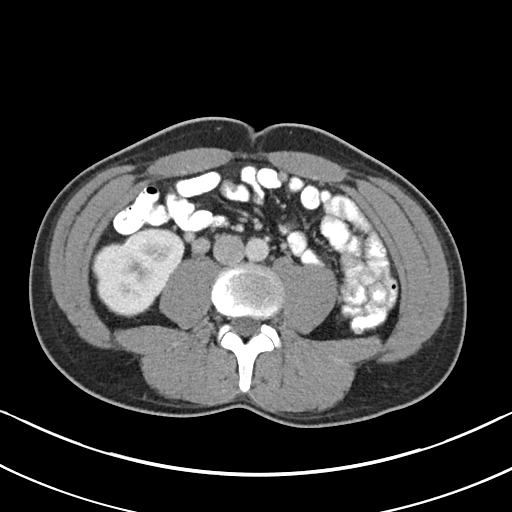
[im 54/85  soft-tissue]
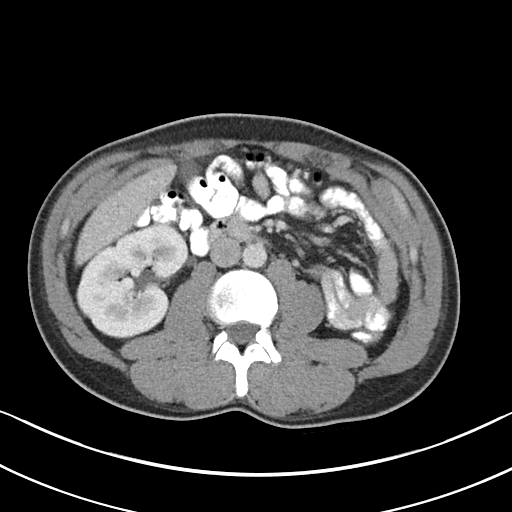
[im 54/85  bone]
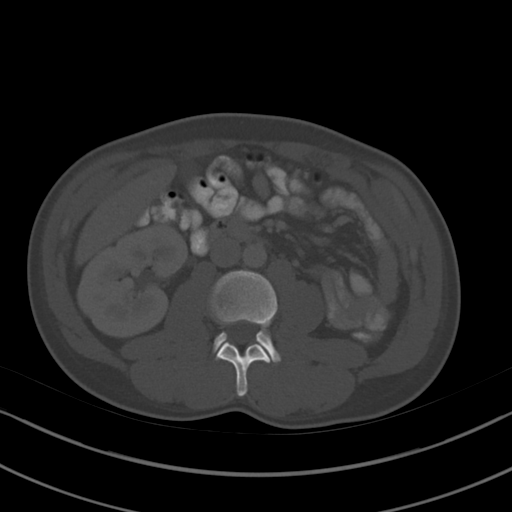
[im 61/85  soft-tissue]
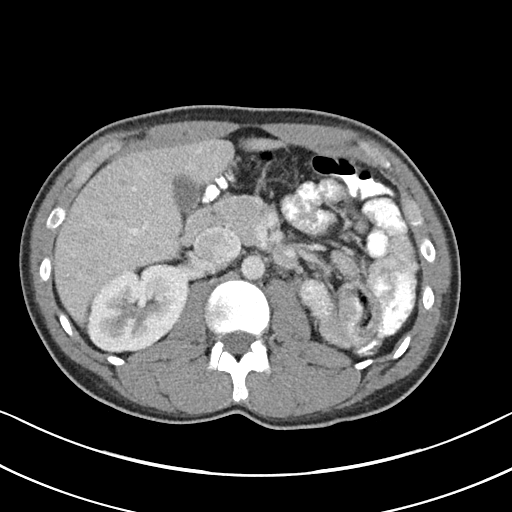
[im 68/85  soft-tissue]
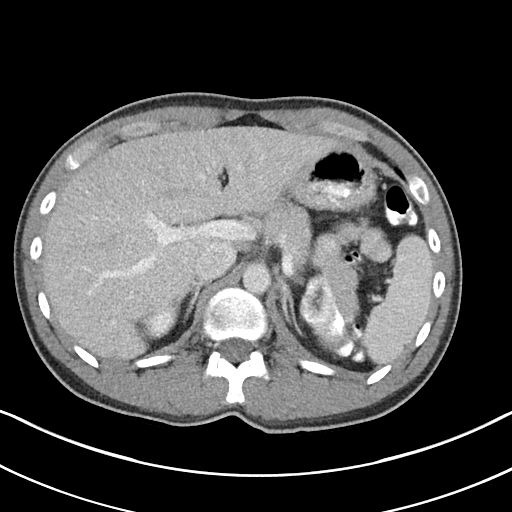
[im 74/85  soft-tissue]
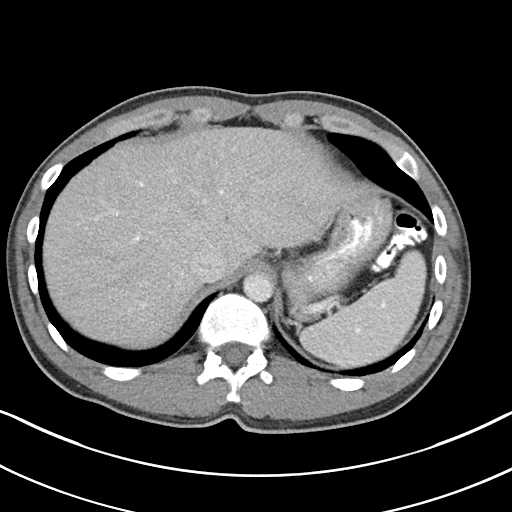
[im 81/85  soft-tissue]
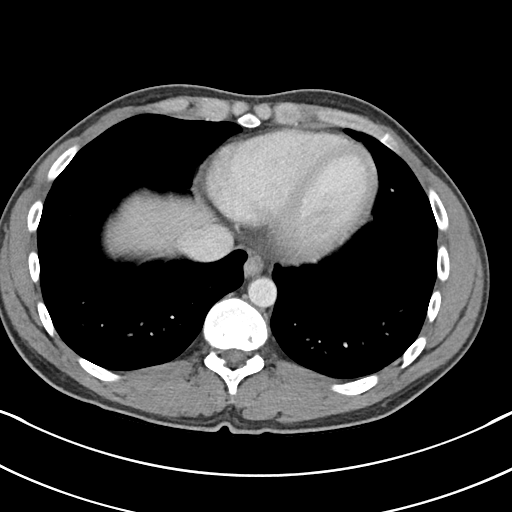

[Series 5: abd/pel w st · coronal · 0.74mm/px · 3 of 80 slices shown]
[im 27/80  soft-tissue]
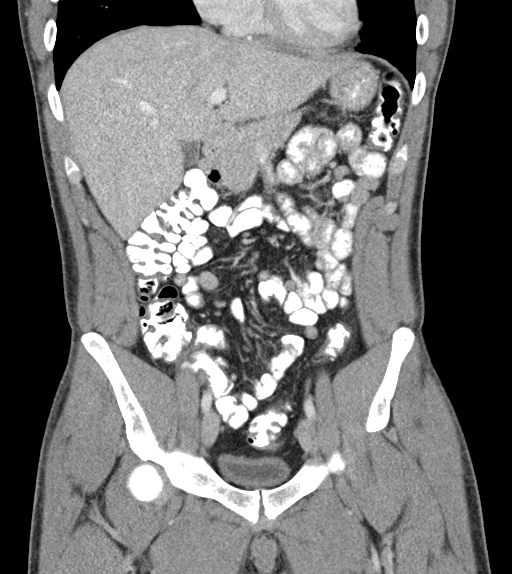
[im 36/80  soft-tissue]
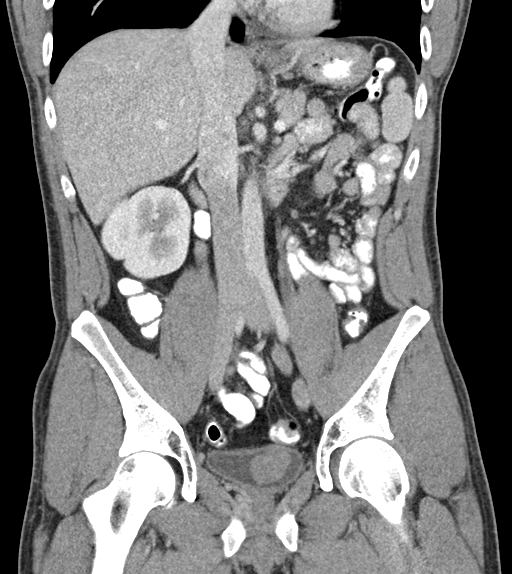
[im 44/80  soft-tissue]
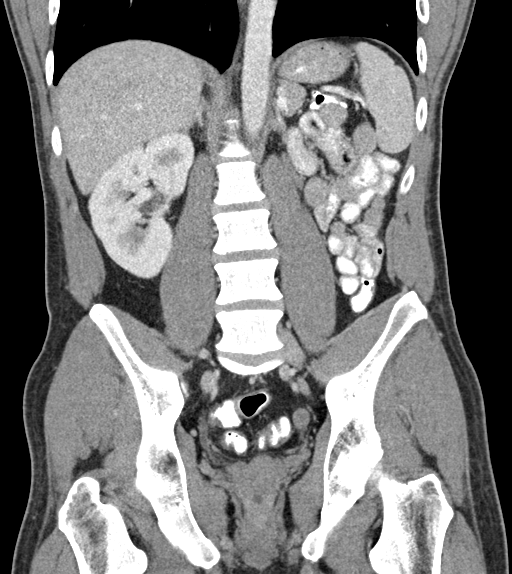

[16 of 46 positions shown; findings below may reference images not displayed]

FINDINGS: Lower chest: No acute abnormality.

Hepatobiliary: No solid liver abnormality is seen. No gallstones,
gallbladder wall thickening, or biliary dilatation.

Pancreas: Unremarkable. No pancreatic ductal dilatation or
surrounding inflammatory changes.

Spleen: Normal in size without significant abnormality.

Adrenals/Urinary Tract: Adrenal glands are unremarkable. There is a
very small, atrophic left renal remnant in the left renal fossa with
normal morphology of the left adrenal gland. The right kidney is
normal in size and location. There is a dilated right ureter with an
obstructing mass at the posterior left aspect of the urinary bladder
in the vicinity of the left ureterovesicular junction measuring
approximately 4.2 x 3.0 cm (series 2, image 69).

Stomach/Bowel: Stomach is within normal limits. Appendix appears
normal. No evidence of bowel wall thickening, distention, or
inflammatory changes.

Vascular/Lymphatic: No significant vascular findings are present. No
enlarged abdominal or pelvic lymph nodes.

Reproductive: No mass or other significant abnormality.

Other: No abdominal wall hernia or abnormality. No abdominopelvic
ascites.

Musculoskeletal: No acute or significant osseous findings.
IMPRESSION: 1. No acute CT findings of the abdomen or pelvis to explain right
lower quadrant abdominal pain. Normal appendix.
2. There is an obstructing mass at the posterior left aspect of the
urinary bladder in the vicinity of the left ureterovesicular
junction measuring approximately 4.2 x 3.0 cm, with a dilated left
ureter and a very small atrophic left renal remnant. There is normal
morphology of the left adrenal gland suggesting prior normal
development of the left kidney and chronic obstruction, obstructing
lesion near the ureterovesicular junction possibly reflecting a
proteinaceous ureterocele versus a solid mass. Bladder malignancy is
generally not favored given patient age and evident chronicity,
however recommend cystoscopy to further evaluate.
3. The right kidney is normal in size and location.

## 2021-01-28 ENCOUNTER — Encounter: Payer: Self-pay | Admitting: Allergy & Immunology

## 2021-01-28 ENCOUNTER — Ambulatory Visit (INDEPENDENT_AMBULATORY_CARE_PROVIDER_SITE_OTHER): Payer: BC Managed Care – PPO | Admitting: Allergy & Immunology

## 2021-01-28 ENCOUNTER — Other Ambulatory Visit: Payer: Self-pay

## 2021-01-28 VITALS — BP 128/70 | HR 68 | Temp 97.7°F | Resp 16 | Ht 70.0 in | Wt 181.2 lb

## 2021-01-28 DIAGNOSIS — J3089 Other allergic rhinitis: Secondary | ICD-10-CM | POA: Diagnosis not present

## 2021-01-28 DIAGNOSIS — J302 Other seasonal allergic rhinitis: Secondary | ICD-10-CM

## 2021-01-28 DIAGNOSIS — T781XXD Other adverse food reactions, not elsewhere classified, subsequent encounter: Secondary | ICD-10-CM

## 2021-01-28 MED ORDER — MONTELUKAST SODIUM 10 MG PO TABS: 10.0000 mg | ORAL_TABLET | Freq: Every day | ORAL | 3 refills | Status: DC

## 2021-01-28 NOTE — Progress Notes (Signed)
FOLLOW UP  Date of Service/Encounter:  01/28/21   Assessment:   Seasonal and perennial allergic rhinitis - interested in immunotherapy but wants to focus just on the pollens to treat his oral allergy syndrome  Pollen-food allergy syndrome  Plan/Recommendations:   1. Seasonal and perennial allergic rhinitis -Information on allergen immunotherapy presented. -His main concern seems to be development of an allergic reaction, so we discussed the risk and frequency of anaphylaxis with immunotherapy. -He has never been on immunotherapy, so he has no reason to worry about having a reaction, but he reports that he is just sensitive. -We will do a schedule A, but I think we can defer on starting in the Silver Vial. -He has had a check with his insurance company and give [place] call back when he makes a decision.  2. Oral allergy syndrome -I did inform him that allergy shots could help with oral allergy syndrome. -I did also tell him that he is less likely to react to these fruits during the off pollen season, especially in the winter. -I also recommended that he try peeling fruits such as apples to see if this increases his tolerability. -He seemed pretty nervous about this.  3. Return in about 1 year (around 01/28/2022).     Subjective:   Daniel Knox is a 31 y.o. male presenting today for follow up of  Chief Complaint  Patient presents with   Allergic Rhinitis     Allergy to pollen is so bad that he can't even eat fresh fruit. Wants to get rid of the pollen allergy so he can finally eat fruit again.    Daniel Knox has a history of the following: Patient Active Problem List   Diagnosis Date Noted   Traumatic injury of rib 12/21/2019   Altered bowel habits 11/17/2019   Other allergic rhinitis 09/25/2019   Adverse food reaction 09/25/2019   Allergic conjunctivitis of both eyes 09/25/2019   Factor V deficiency ([place]) 06/26/2019   Diarrhea 06/26/2019   Food intolerance in  adult 06/26/2019   Solitary kidney, congenital 06/22/2019   Bladder mass 06/22/2019    History obtained from: chart review and patient.  Daniel Knox is a 31 y.o. male presenting for a follow up visit.  He was last seen in September 2021.  At that time, he was evaluated by Dr. Maudie Mercury for an adverse food reaction.  He had testing that was positive to multiple indoor and outdoor allergens with slight reactivity to orange, cantaloupe, and watermelon.  This is felt to be oral allergy syndrome.  For his allergic rhinitis, he was started on an over-the-counter antihistamine and allergen immunotherapy was discussed.  Since the last visit, he has mostly done well. He lives on a lot of allergy medications in general. He hit his deductible in 2021 and that is what brought him to see [place]. He started having the problems when he was 16 with fresh fruit. He no longer eats fresh fruits. He does not have an EpiPen at all. He has not tried peeling fruits or eating during the winter months.   He is interested in allergy shots. He really wants to focus on the pollen. He is not interested in focusing on the cat (he is never around them) and he has no mold exposure. He never really did look into allergy shots at all. He lives in [place] and works in [place], commute is around 30 minutes. They are looking into moving in the next 1-2 years. His wife  works in [place] and they want to be closer to her work. She is a lead tax person for [employer].   He is fine during the winter. From spring through fall, he uses antihistamines like candy. He tends to alternate between antihistamines. He switches up whenever he feels that it is not working. He sticks to one antihistamine for 1-2 years, but then changes things around. He is not on any nose sprays right now, but he does not use them.  He has never been on montelukast.  He is open to trying something new, but is not really interested in no sprays.  He and his wife tend to buy a new  house every couple years.  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  Apparently, if you keep a house for less than 2 years, there are no capital gains taxes.  He tried selling his house and actually had an offer in January 2022, but they did not have another place to move to so they ended up not selling.  Otherwise, there have been no changes to his past medical history, surgical history, family history, or social history.    Review of Systems  Constitutional: Negative.  Negative for chills, fever, malaise/fatigue and weight loss.  HENT:  Positive for congestion. Negative for ear discharge, ear pain and sinus pain.   Eyes:  Negative for pain, discharge and redness.  Respiratory:  Negative for cough, sputum production, shortness of breath and wheezing.   Cardiovascular: Negative.  Negative for chest pain and palpitations.  Gastrointestinal:  Negative for abdominal pain, constipation, diarrhea, heartburn, nausea and vomiting.  Skin: Negative.  Negative for itching and rash.  Neurological:  Negative for dizziness and headaches.  Endo/Heme/Allergies:  Positive for environmental allergies. Does not bruise/bleed easily.      Objective:   Blood pressure 128/70, pulse 68, temperature 97.7 F (36.5 C), temperature source Temporal, resp. rate 16, height 5\' 10"  (1.778 m), weight 181 lb 3.2 oz (82.2 kg), SpO2 97 %. Body mass index is 26 kg/m.   Physical Exam:  Physical Exam Vitals reviewed.  Constitutional:      Appearance: He is well-developed.     Comments: Friendly male.  Very cooperative.  HENT:     Head: Normocephalic and atraumatic.     Right Ear: Tympanic membrane, ear canal and external ear normal.     Left Ear: Tympanic membrane, ear canal and external ear normal.     Nose: No nasal deformity, septal deviation, mucosal edema or rhinorrhea.     Right Turbinates: Enlarged, swollen and pale.     Left Turbinates: Enlarged, swollen and pale.     Right Sinus: No maxillary  sinus tenderness or frontal sinus tenderness.     Left Sinus: No maxillary sinus tenderness or frontal sinus tenderness.     Mouth/Throat:     Mouth: Mucous membranes are not pale and not dry.     Pharynx: Uvula midline.  Eyes:     General: Lids are normal. No allergic shiner.       Right eye: No discharge.        Left eye: No discharge.     Conjunctiva/sclera: Conjunctivae normal.     Right eye: Right conjunctiva is not injected. No chemosis.    Left eye: Left conjunctiva is not injected. No chemosis.    Pupils: Pupils are equal, round, and reactive to light.  Cardiovascular:     Rate and Rhythm: Normal rate and regular rhythm.  Heart sounds: Normal heart sounds.  Pulmonary:     Effort: Pulmonary effort is normal. No tachypnea, accessory muscle usage or respiratory distress.     Breath sounds: Normal breath sounds. No wheezing, rhonchi or rales.     Comments: Moving air well in all lung fields.  No increased work of breathing. Chest:     Chest wall: No tenderness.  Lymphadenopathy:     Cervical: No cervical adenopathy.  Skin:    Coloration: Skin is not pale.     Findings: No abrasion, erythema, petechiae or rash. Rash is not papular, urticarial or vesicular.  Neurological:     Mental Status: He is alert.  Psychiatric:        Behavior: Behavior is cooperative.     Diagnostic studies: none        Salvatore Marvel, MD  [facility]

## 2021-01-28 NOTE — Patient Instructions (Addendum)
1. Seasonal and perennial allergic rhinitis -Information on allergen immunotherapy presented. -His main concern seems to be development of an allergic reaction, so we discussed the risk and frequency of anaphylaxis with immunotherapy. -He has never been on immunotherapy, so he has no reason to worry about having a reaction, but he reports that he is just sensitive. -We will do a schedule A, but I think we can defer on starting in the Silver Vial. -He has had a check with his insurance company and give [place] call back when he makes a decision.  2. Oral allergy syndrome -I did inform him that allergy shots could help with oral allergy syndrome. -I did also tell him that he is less likely to react to these fruits during the off pollen season, especially in the winter. -I also recommended that he try peeling fruits such as apples to see if this increases his tolerability. -He seemed pretty nervous about this.  3. Return in about 1 year (around 01/28/2022).    Please inform us of any Emergency Department visits, hospitalizations, or changes in symptoms. Call us before going to the ED for breathing or allergy symptoms since we might be able to fit you in for a sick visit. Feel free to contact us anytime with any questions, problems, or concerns.  It was a pleasure to see you again today!  Websites that have reliable patient information: 1. American Academy of Asthma, Allergy, and Immunology: www.aaaai.org 2. Food Allergy Research and Education (FARE): foodallergy.org 3. Mothers of Asthmatics: http://www.asthmacommunitynetwork.org 4. American College of Allergy, Asthma, and Immunology: www.acaai.org   COVID-19 Vaccine Information can be found at: ShippingScam.co.uk For questions related to vaccine distribution or appointments, please email [email or web address] or call [phone].   We realize that you might be concerned about having an  allergic reaction to the COVID19 vaccines. To help with that concern, WE ARE OFFERING THE COVID19 VACCINES IN OUR OFFICE! Ask the front desk for dates!     Like [place] on [employer] and Instagram for our latest updates!      A healthy democracy works best when [employer] participate! Make sure you are registered to vote! If you have moved or changed any of your contact information, you will need to get this updated before voting!  In some cases, you MAY be able to register to vote online: [email or web address]

## 2021-05-08 ENCOUNTER — Other Ambulatory Visit: Payer: Self-pay | Admitting: Allergy & Immunology

## 2021-06-10 IMAGING — US US PELVIS LIMITED
2 series · 13 of 25 positions shown · non-contrast
Comparison: CT abdomen and pelvis 06/02/2019

CLINICAL DATA: Abnormal CT, bladder mass

EXAM:
LIMITED ULTRASOUND OF PELVIS
TECHNIQUE: Limited transabdominal ultrasound examination of the pelvis focused
on the urinary bladder was performed.

[Series 1: us pelvis limited · 0.14mm/px · 11 of 25 slices shown (1 of 2)]
[im 1/25]
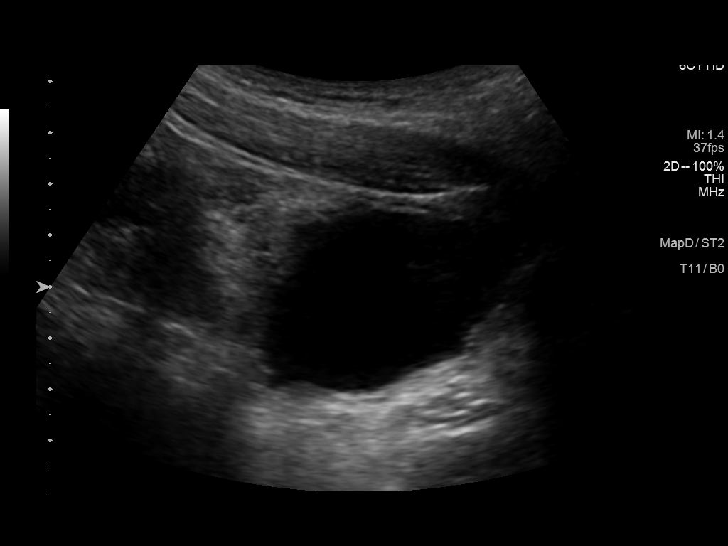
[im 3/25]
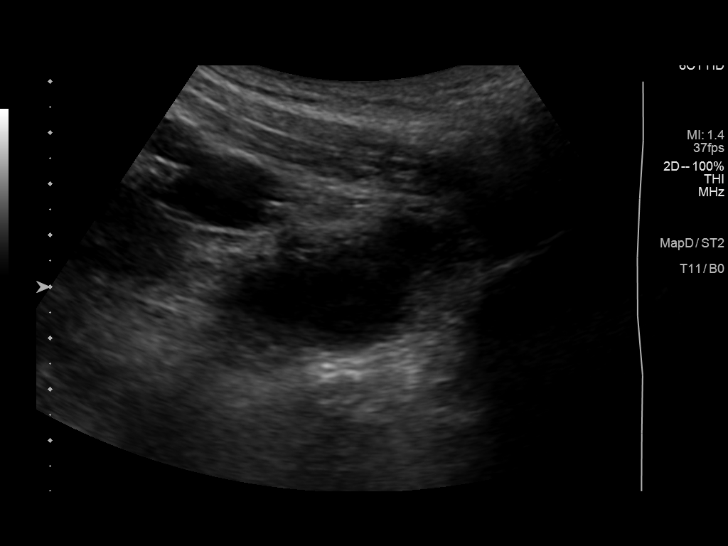
[im 5/25]
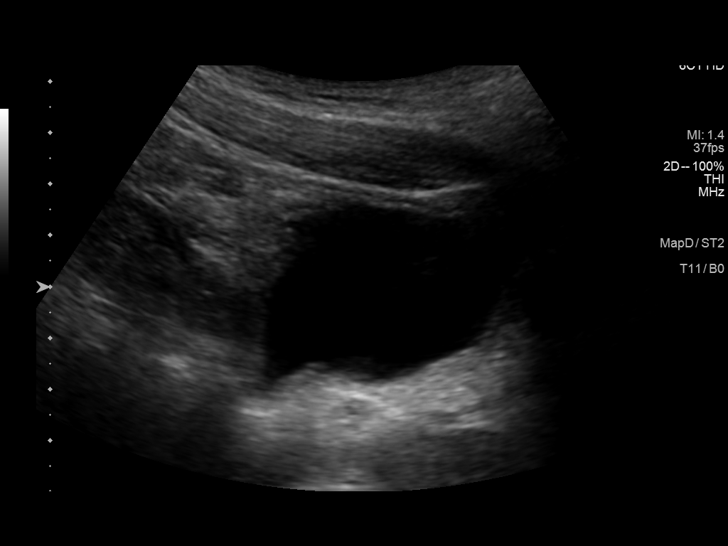
[im 7/25]
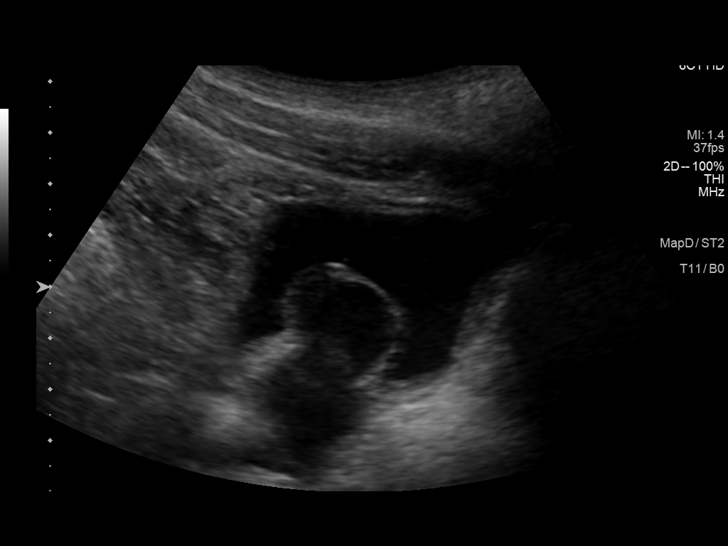
[im 10/25]
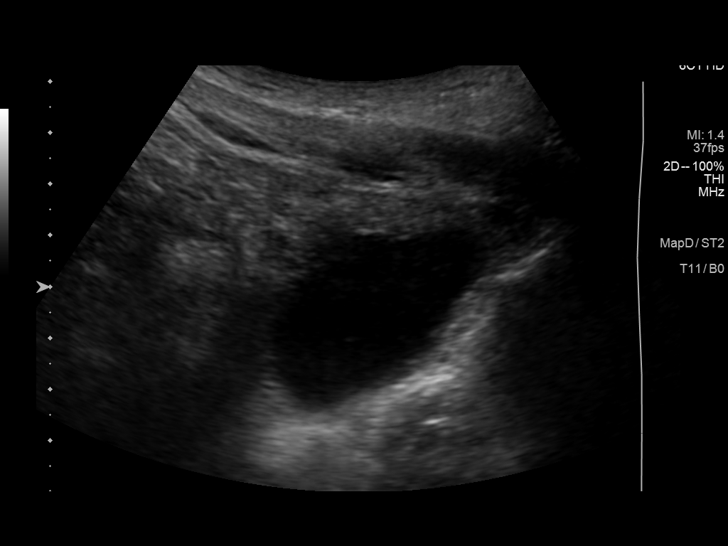
[im 12/25]
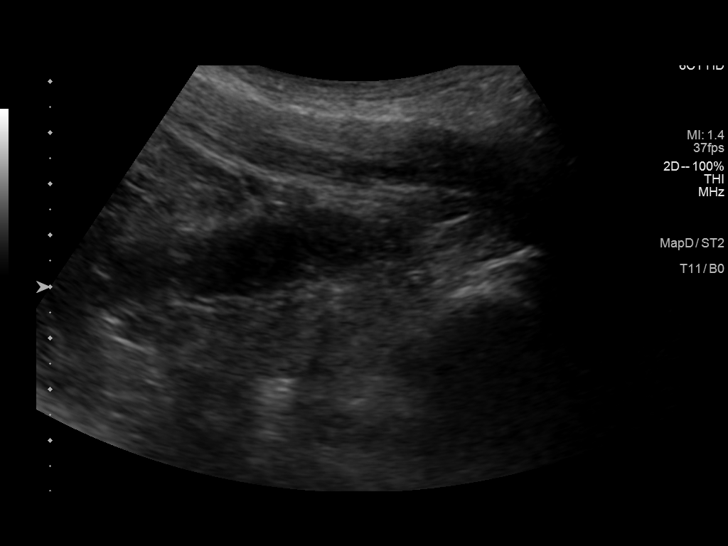
[im 14/25]
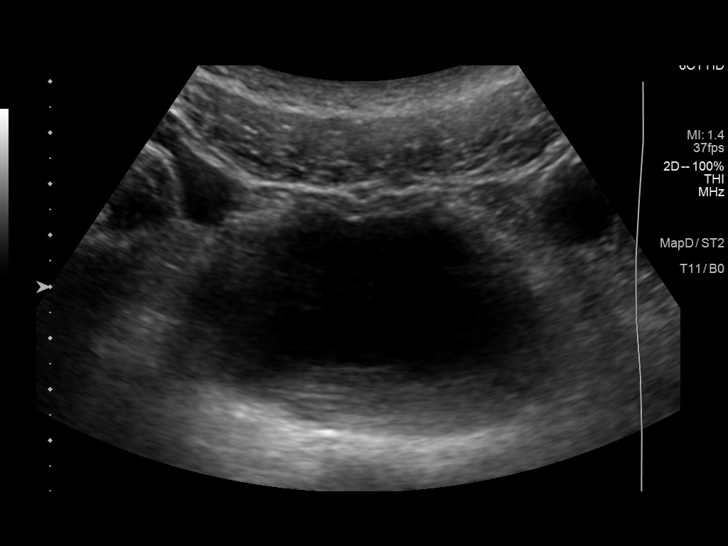
[im 17/25]
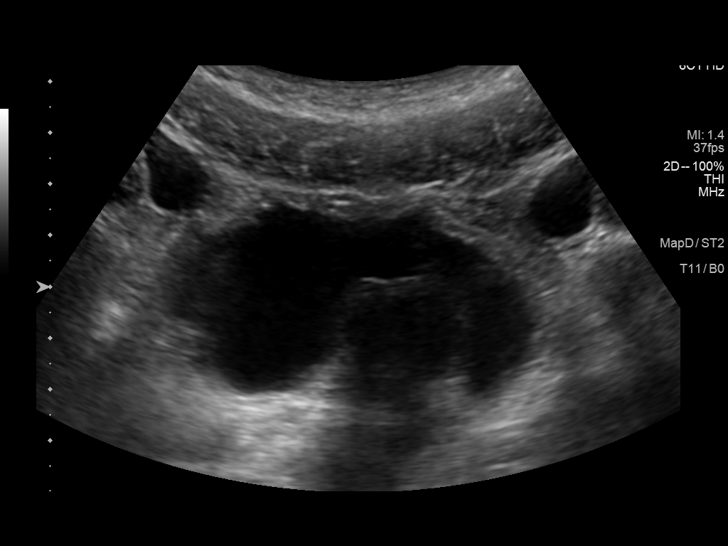
[im 19/25]
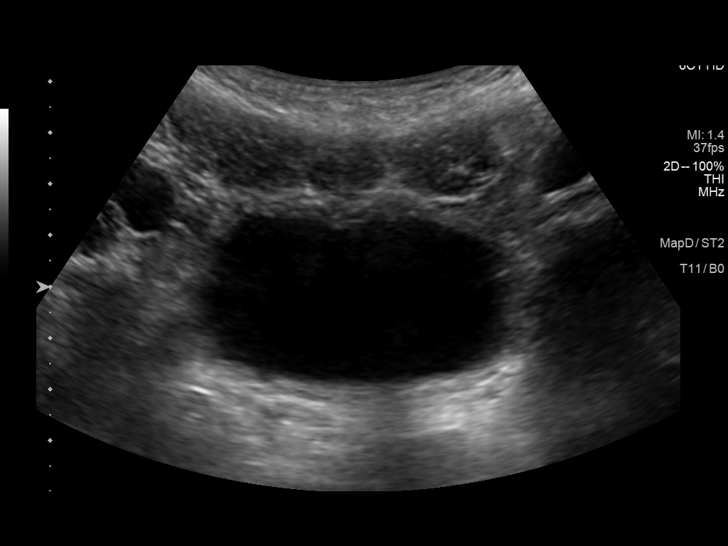
[im 21/25]
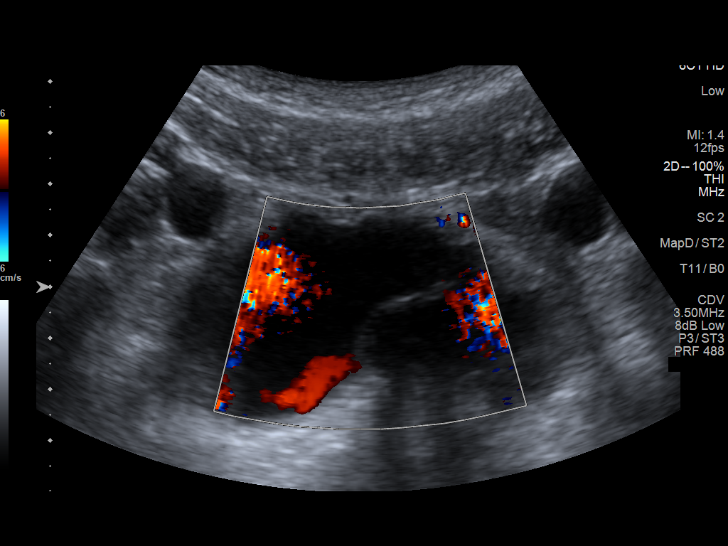
[im 23/25]
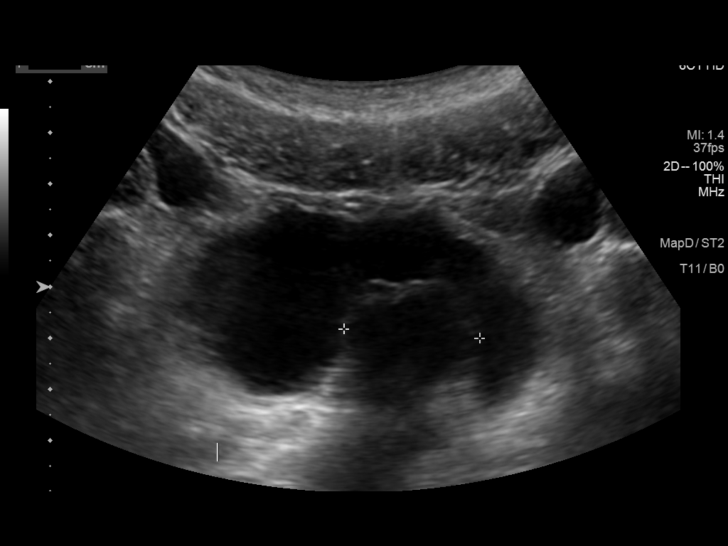

[Series 2: us pelvis limited · 0.14mm/px · 2 of 4 slices shown (2 of 2)]
[im 1/4]
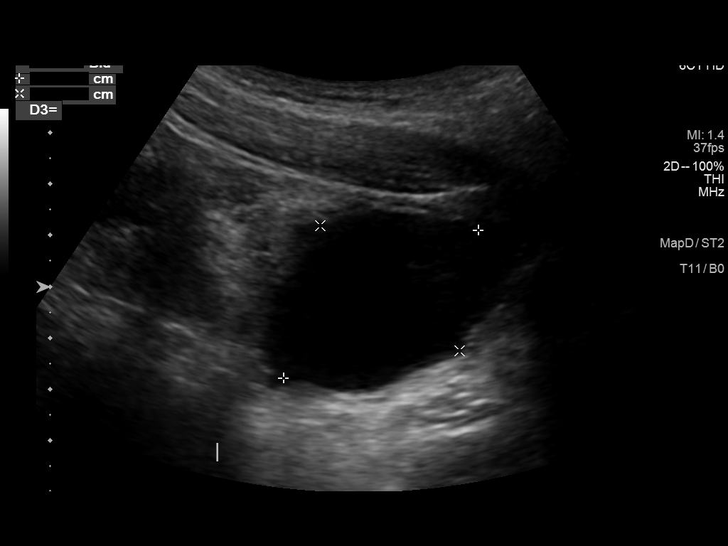
[im 4/4]
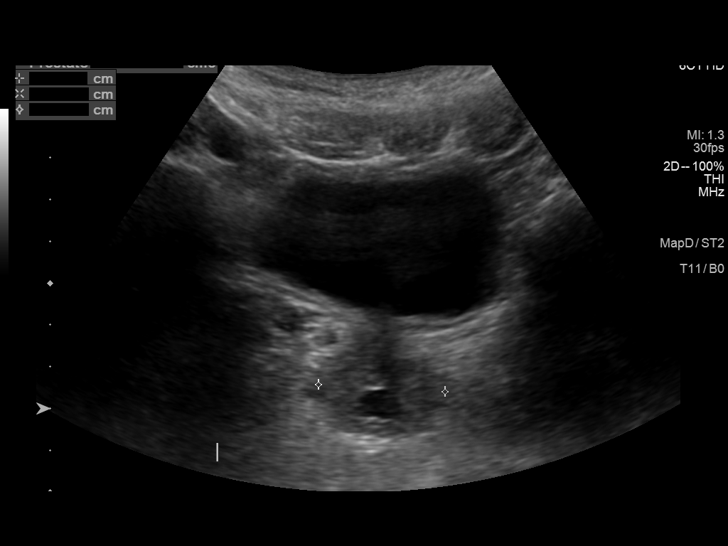

[13 of 25 positions shown; findings below may reference images not displayed]

FINDINGS: Complex cystic mass identified intraluminal at the posterior bladder
base, measuring 2.7 x 2.1 x 2.7 cm in size. This is LEFT of midline
and contains scattered internal echoes. This corresponds to the
intermediate attenuation mass seen on the prior CT. Visualized mass
by ultrasound is smaller than that seen on the prior CT, with
current ultrasound exam failing to delineate the portion of the mass
which previously extended extra vesicular. Remainder of bladder
unremarkable. Prostate gland normal in size measuring 3.7 x 2.2 x
3.0 cm (volume = 13 cm^3).
IMPRESSION: 2.7 x 2.1 x 2.7 cm diameter complex cystic mass intraluminal at the
posterior bladder base LEFT of midline, corresponding to the
intermediate attenuation mass seen on prior CT.

Since this appears complex cystic rather than solid by ultrasound,
favor large ureterocele containing debris, other cystic masses
considered unlikely; ureterocele could be confirmed by cystoscopy,
though MR would be of benefit in establishing size and extra
vesicular extent of the lesion.

## 2021-10-07 ENCOUNTER — Ambulatory Visit (INDEPENDENT_AMBULATORY_CARE_PROVIDER_SITE_OTHER): Payer: BC Managed Care – PPO | Admitting: Family Medicine

## 2021-10-07 ENCOUNTER — Encounter: Payer: Self-pay | Admitting: Family Medicine

## 2021-10-07 VITALS — BP 110/70 | HR 66 | Temp 97.4°F | Ht 68.0 in | Wt 176.0 lb

## 2021-10-07 DIAGNOSIS — R5383 Other fatigue: Secondary | ICD-10-CM

## 2021-10-07 DIAGNOSIS — Z13220 Encounter for screening for lipoid disorders: Secondary | ICD-10-CM

## 2021-10-07 DIAGNOSIS — Z0000 Encounter for general adult medical examination without abnormal findings: Secondary | ICD-10-CM | POA: Diagnosis not present

## 2021-10-07 DIAGNOSIS — K9049 Malabsorption due to intolerance, not elsewhere classified: Secondary | ICD-10-CM

## 2021-10-07 DIAGNOSIS — Q600 Renal agenesis, unilateral: Secondary | ICD-10-CM

## 2021-10-07 LAB — CBC
HCT: 43.7 % (ref 39.0–52.0)
Hemoglobin: 14.6 g/dL (ref 13.0–17.0)
MCHC: 33.5 g/dL (ref 30.0–36.0)
MCV: 91.7 fl (ref 78.0–100.0)
Platelets: 222 10*3/uL (ref 150.0–400.0)
RBC: 4.76 Mil/uL (ref 4.22–5.81)
RDW: 12.7 % (ref 11.5–15.5)
WBC: 6.2 10*3/uL (ref 4.0–10.5)

## 2021-10-07 LAB — LIPID PANEL
Cholesterol: 191 mg/dL (ref 0–200)
HDL: 66.2 mg/dL (ref >39.00)
LDL Cholesterol: 109 mg/dL — ABNORMAL HIGH (ref 0–99)
NonHDL: 124.84
Total CHOL/HDL Ratio: 3
Triglycerides: 77 mg/dL (ref 0.0–149.0)
VLDL: 15.4 mg/dL (ref 0.0–40.0)

## 2021-10-07 LAB — COMPREHENSIVE METABOLIC PANEL
ALT: 25 U/L (ref 0–53)
AST: 27 U/L (ref 0–37)
Albumin: 4.8 g/dL (ref 3.5–5.2)
Alkaline Phosphatase: 43 U/L (ref 39–117)
BUN: 12 mg/dL (ref 6–23)
CO2: 29 mEq/L (ref 19–32)
Calcium: 9.7 mg/dL (ref 8.4–10.5)
Chloride: 101 mEq/L (ref 96–112)
Creatinine, Ser: 1.06 mg/dL (ref 0.40–1.50)
GFR: 94.4 mL/min (ref >60.00)
Glucose, Bld: 81 mg/dL (ref 70–99)
Potassium: 4.1 mEq/L (ref 3.5–5.1)
Sodium: 138 mEq/L (ref 135–145)
Total Bilirubin: 0.9 mg/dL (ref 0.2–1.2)
Total Protein: 7.3 g/dL (ref 6.0–8.3)

## 2021-10-07 LAB — FERRITIN: Ferritin: 106.5 ng/mL (ref 22.0–322.0)

## 2021-10-07 LAB — VITAMIN B12: Vitamin B-12: 411 pg/mL (ref 211–911)

## 2021-10-07 LAB — TSH: TSH: 0.71 u[IU]/mL (ref 0.35–5.50)

## 2021-10-07 LAB — VITAMIN D 25 HYDROXY (VIT D DEFICIENCY, FRACTURES): VITD: 64.44 ng/mL (ref 30.00–100.00)

## 2021-10-07 NOTE — Assessment & Plan Note (Signed)
Suspect this is related to sleep, vies trying to get 7 hours of sleep.  Will check labs given patient's limited diet of fruits and vegetables.  He also has a solitary kidney will assess for kidney function.

## 2021-10-07 NOTE — Patient Instructions (Addendum)
Try to get 1 extra hour overnight consistently  Labs today - liver and kidney  Consider multivitamin   Set Boundaries, Find Peace -- Book about telling people

## 2021-10-07 NOTE — Assessment & Plan Note (Signed)
Assess for nutritional deficiencies, advised considering a daily multivitamin due to limitations.  He is also planning to Pursue allergy injections with an allergist at the first of the year.

## 2021-10-07 NOTE — Progress Notes (Signed)
Annual Exam   Chief Complaint:  Chief Complaint  Patient presents with   Annual Exam    No concerns     History of Present Illness:  GUISEPPE FLANAGAN is a 31 y.o. presents today for annual examination.    #Tired - wakes up between 2-3 AM and works until he's done - helps unload - finishes in 10-11 hours - goes to bed around 8-9 pm to get six hours of sleep - does not nap - does a lot of house keeping - works 5 days a week - wakes up feeling rested - snores - falls asleep w/o issues  Factor V and allergies - cannot eat vitamin k or fruits   Nutrition/Lifestyle Diet: limited in fresh fruit or veggies - can do cooked things, eats meats and grains w/o issues Exercise: yardwork, golf/ softball and work-out He is single partner, contraception - IUD.  Any issues getting or maintaining an erection? no  Social History   Tobacco Use  Smoking Status Never  Smokeless Tobacco Current   Types: Chew   Social History   Substance and Sexual Activity  Alcohol Use Yes   Comment: a few times a week, 1-4 depending on the day   Social History   Substance and Sexual Activity  Drug Use No     Safety The patient wears seatbelts: not regularly      The patient feels safe at home and in their relationships: yes.  General Health Dentist in the last year: Yes Eye doctor: yes  Weight Wt Readings from Last 3 Encounters:  10/07/21 176 lb (79.8 kg)  01/28/21 181 lb 3.2 oz (82.2 kg)  12/21/19 172 lb (78 kg)   Patient has high BMI  BMI Readings from Last 1 Encounters:  10/07/21 26.76 kg/m     Chronic disease screening Blood pressure monitoring:  BP Readings from Last 3 Encounters:  10/07/21 110/70  01/28/21 128/70  12/21/19 112/70    Lipid Monitoring: Indication for screening: age >35, obesity, diabetes, family hx, CV risk factors.  Lipid screening: Yes  No results found for: "CHOL", "HDL", "LDLCALC", "LDLDIRECT", "TRIG", "CHOLHDL"   Diabetes Screening: age >69,  overweight, family hx, PCOS, hx of gestational diabetes, at risk ethnicity, elevated blood pressure >135/80.  Diabetes Screening screening: Yes  No results found for: "HGBA1C"   Immunization History  Administered Date(s) Administered   Tdap 01/12/2018    Past Medical History:  Diagnosis Date   Allergies    Clotting disorder ([place])    Factor V deficiency ([place])    Kidney congenitally absent, left     Past Surgical History:  Procedure Laterality Date   EYE SURGERY     lazy eye   LEG SURGERY     broken leg    Prior to Admission medications   Medication Sig Start Date End Date Taking? Authorizing Provider  fexofenadine-pseudoephedrine (ALLEGRA-D 24) 180-240 MG 24 hr tablet Take 1 tablet by mouth daily.   Yes [provider]    No Known Allergies   Social History   Socioeconomic History   Marital status: Married    Spouse name: Myna Bright   Number of children: 0   Years of education: Some college   Highest education level: Not on file  Occupational History   Occupation: cdl driver   Tobacco Use   Smoking status: Never   Smokeless tobacco: Current    Types: Chew  Vaping Use   Vaping Use: Some days  Substance and Sexual Activity  Alcohol use: Yes    Comment: a few times a week, 1-4 depending on the day   Drug use: No   Sexual activity: Yes    Birth control/protection: I.U.D.    Comment: Married  Other Topics Concern   Not on file  Social History Narrative   06/26/19   From: the area   Living: with wife, Myna Bright (2018)   Work: Drives a Engineer, maintenance       Family: parents nearby, good relationship      Enjoys: working on housing, work keeps him busy, play golf      Exercise: golf   Diet: varied, limits leafy greens      Safety   Seat belts: Yes    Guns: Yes  and secure    Safe in relationships: Yes    Social Determinants of Radio broadcast assistant Strain: Not on Art therapist Insecurity: Not on file  Transportation Needs: Not on file  Physical  Activity: Not on file  Stress: Not on file  Social Connections: Not on file  Intimate Partner Violence: Not on file    Family History  Problem Relation Age of Onset   Healthy Mother    Factor V Leiden deficiency Father    Factor V Leiden deficiency Paternal Grandmother    Cancer Neg Hx    Heart failure Neg Hx    Diabetes Neg Hx    Hyperlipidemia Neg Hx    Colon cancer Neg Hx    Colon polyps Neg Hx    Stomach cancer Neg Hx    Esophageal cancer Neg Hx     Review of Systems  Constitutional:  Negative for chills and fever.  HENT:  Negative for congestion and sore throat.   Eyes:  Negative for blurred vision and double vision.  Respiratory:  Negative for shortness of breath.   Cardiovascular:  Negative for chest pain.  Gastrointestinal:  Negative for heartburn, nausea and vomiting.  Genitourinary: Negative.   Musculoskeletal: Negative.  Negative for myalgias.  Skin:  Negative for rash.  Neurological:  Negative for dizziness and headaches.  Endo/Heme/Allergies:  Does not bruise/bleed easily.  Psychiatric/Behavioral:  Negative for depression. The patient is not nervous/anxious.      Physical Exam BP 110/70   Pulse 66   Temp (!) 97.4 F (36.3 C) (Temporal)   Ht '5\' 8"'$  (1.727 m)   Wt 176 lb (79.8 kg)   SpO2 99%   BMI 26.76 kg/m    BP Readings from Last 3 Encounters:  10/07/21 110/70  01/28/21 128/70  12/21/19 112/70      Physical Exam Constitutional:      General: He is not in acute distress.    Appearance: He is well-developed. He is not diaphoretic.  HENT:     Head: Normocephalic and atraumatic.     Right Ear: Tympanic membrane and ear canal normal.     Left Ear: Tympanic membrane and ear canal normal.     Nose: Nose normal.     Mouth/Throat:     Pharynx: Uvula midline.  Eyes:     General: No scleral icterus.    Conjunctiva/sclera: Conjunctivae normal.     Pupils: Pupils are equal, round, and reactive to light.  Cardiovascular:     Rate and Rhythm:  Normal rate and regular rhythm.     Heart sounds: Normal heart sounds. No murmur heard. Pulmonary:     Effort: Pulmonary effort is normal. No respiratory distress.     Breath sounds:  Normal breath sounds. No wheezing.  Abdominal:     General: Bowel sounds are normal. There is no distension.     Palpations: Abdomen is soft. There is no mass.     Tenderness: There is no abdominal tenderness. There is no guarding.  Musculoskeletal:        General: Normal range of motion.     Cervical back: Normal range of motion and neck supple.  Lymphadenopathy:     Cervical: No cervical adenopathy.  Skin:    General: Skin is warm and dry.     Capillary Refill: Capillary refill takes less than 2 seconds.  Neurological:     Mental Status: He is alert and oriented to person, place, and time.        Results:  PHQ-9:      No data to display             Assessment: 31 y.o. here for routine annual physical examination.  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  He is also planning to Pursue allergy injections with an allergist at the first of the year.      Relevant Orders   Comprehensive metabolic panel   Vitamin D, 25-hydroxy   TSH   Lipid panel   CBC   Other fatigue    Suspect this is related to sleep, vies trying to get 7 hours of sleep.  Will check labs given patient's limited diet of fruits and vegetables.  He also has a solitary kidney will assess for kidney function.      Relevant Orders   Vitamin D, 25-hydroxy   TSH   CBC   Vitamin B12   Ferritin   Other Visit Diagnoses     Annual physical exam    -  Primary   Relevant Orders   Lipid panel   Screening for hyperlipidemia           Screening: -- Blood pressure  screen normal -- cholesterol screening: will obtain -- Weight screening: overweight: continue to monitor -- Diabetes Screening: will obtain -- Nutrition: Encouraged healthy diet and exercise  The ASCVD Risk score (Arnett DK, et al., 2019) failed to calculate for the following reasons:   The 2019 ASCVD risk score is only valid for ages 63 to 43  -- Statin therapy for Age 49-75 with CVD risk >7.5%  Psych -- Depression screening (PHQ-9): no symptoms  Safety -- tobacco screening: using: discussed quitting using the 5 A's -- alcohol screening:  low-risk usage. -- no evidence of domestic violence or intimate partner violence.   Cancer Screening -- No age related cancer screening due  Immunizations Immunization History  Administered Date(s) Administered   Tdap 01/12/2018    -- flu vaccine declined -- TDAP q10 years up to date  -- Covid-19 Vaccine up to date   Encouraged regular vision and dental screening. Encouraged healthy exercise and diet.   Lesleigh Noe

## 2022-01-27 ENCOUNTER — Ambulatory Visit: Payer: BC Managed Care – PPO | Admitting: Allergy & Immunology

## 2022-01-27 ENCOUNTER — Other Ambulatory Visit: Payer: Self-pay

## 2022-01-27 ENCOUNTER — Encounter: Payer: Self-pay | Admitting: Allergy & Immunology

## 2022-01-27 VITALS — BP 124/80 | HR 64 | Temp 98.0°F | Resp 16 | Wt 177.0 lb

## 2022-01-27 DIAGNOSIS — T781XXD Other adverse food reactions, not elsewhere classified, subsequent encounter: Secondary | ICD-10-CM | POA: Diagnosis not present

## 2022-01-27 DIAGNOSIS — J3089 Other allergic rhinitis: Secondary | ICD-10-CM | POA: Diagnosis not present

## 2022-01-27 NOTE — Progress Notes (Signed)
FOLLOW UP  Date of Service/Encounter:  01/27/22   Assessment:   Seasonal and perennial allergic rhinitis - interested in immunotherapy but wants to focus just on the pollens to treat his oral allergy syndrome   Pollen-food allergy syndrome  Anxious   Plan/Recommendations:   1. Seasonal and perennial allergic rhinitis - Information on allergen immunotherapy presented. - Make an appointment to start allergy shots (once you have your first injection, you can come in whenever you are able to without an appointment).  - We will focus on the pollens only.  - However, this will leave out the outdoor molds (we would have to do TWO injections weekly to include the molds since we cannot mix molds with anything else).  - We could always add the molds in the future if you are still having issues with outdoor exposures after you reach maintenance on the pollens.   2. Oral allergy syndrome - Allergy shots could help with oral allergy syndrome. - Hopefully we will be able to tolerate fruit once you are on maintenance.   3. Return in about 6 months (around 07/28/2022).   Subjective:   Daniel Knox is a 32 y.o. male presenting today for follow up of  Chief Complaint  Patient presents with   Follow-up    Discuss starting allergy shots.     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  Daniel Knox is a 32 y.o. male presenting for a follow up visit.  He was last seen in January 2023.  At that time, he had multiple indoor and outdoor  allergens.  He also had a history of oral allergy syndrome.  He is very worried about having allergic reactions.  He previously had testing with Dr. Maudie Mercury that was positive to multiple indoor and outdoor allergens.  Since the last visit, he has done fairly well.   Allergic Rhinitis Symptom History: Environmental allergies are a problem still. He went on a cruise right before Christmas and went to several places in [place]. The weather was nice, but it was their growing season and he noticed worsening symptoms when he was on the cruise.  He did a fair amount of gambling and 1 around $1200 in total. He is using an alternating antihistamine. He does not take it in the winter months at all.  He really only has issues during the growing season.  He goes off of all of his medications in the wintertime.  He wants to only include outdoor allergens and his allergy shots.  He remains interested in them and says that his insurance "pays for allergy shots".  We did give him CPT codes at the last visit, but he does need additional ones today.  His last testing was done in September 2021:     Food Allergy Symptom History: Previously, his only concerns were apple as well as a few other fruits.  We have tested him in the past and the only reactive fruits were orange, cantaloupe, and watermelon.  He does not have an  EpiPen.  He works at [employer]. Our office is on his way back to his home. So this works better for him from an office perspective.   Otherwise, there have been no changes to his past medical history, surgical history, family history, or social history.    Review of Systems  Constitutional: Negative.  Negative for chills, fever, malaise/fatigue and weight loss.  HENT:  Positive for congestion. Negative for ear discharge, ear pain and sinus pain.   Eyes:  Negative for pain, discharge and redness.  Respiratory:  Negative for cough, sputum production, shortness of breath  and wheezing.   Cardiovascular: Negative.  Negative for chest pain and palpitations.  Gastrointestinal:  Negative for abdominal pain, constipation, diarrhea, heartburn, nausea and vomiting.  Skin: Negative.  Negative for itching and rash.  Neurological:  Negative for dizziness and headaches.  Endo/Heme/Allergies:  Positive for environmental allergies. Does not bruise/bleed easily.       Objective:   Blood pressure 124/80, pulse 64, temperature 98 F (36.7 C), temperature source Temporal, resp. rate 16, weight 177 lb (80.3 kg), SpO2 99 %. Body mass index is 26.91 kg/m.    Physical Exam Vitals reviewed.  Constitutional:      Appearance: He is well-developed.     Comments: Friendly male.  Very cooperative. Anxious.   HENT:     Head: Normocephalic and atraumatic.     Right Ear: Tympanic membrane, ear canal and external ear normal.     Left Ear: Tympanic membrane, ear canal and external ear normal.     Nose: No nasal deformity, septal deviation, mucosal edema or rhinorrhea.     Right Turbinates: Enlarged, swollen and pale.     Left Turbinates: Enlarged, swollen and pale.     Right Sinus: No maxillary sinus tenderness or frontal sinus tenderness.     Left Sinus: No maxillary sinus tenderness or frontal sinus tenderness.     Comments: No nasal polyps.     Mouth/Throat:     Mouth: Mucous membranes are not pale and not dry.     Pharynx: Uvula midline.  Eyes:     General: Lids are normal. No allergic shiner.       Right eye: No discharge.        Left eye: No discharge.     Conjunctiva/sclera: Conjunctivae normal.     Right eye: Right conjunctiva is not injected. No chemosis.    Left eye: Left conjunctiva is not injected. No chemosis.    Pupils: Pupils are equal, round, and reactive to light.  Cardiovascular:     Rate and Rhythm: Normal rate and regular rhythm.     Heart sounds: Normal heart sounds.  Pulmonary:     Effort: Pulmonary effort is normal. No tachypnea, accessory  muscle usage or respiratory distress.     Breath sounds: Normal breath sounds. No wheezing, rhonchi or rales.     Comments: Moving air well in all lung fields.  No increased work of breathing. Chest:     Chest wall: No tenderness.  Lymphadenopathy:     Cervical: No cervical adenopathy.  Skin:    Coloration: Skin is not pale.     Findings: No abrasion, erythema, petechiae or rash. Rash is not papular, urticarial or vesicular.  Neurological:     Mental Status: He is alert.  Psychiatric:        Behavior: Behavior is cooperative.      Diagnostic studies: none      Fara Olden  Ernst Bowler, MD  [facility]

## 2022-01-27 NOTE — Patient Instructions (Addendum)
1. Seasonal and perennial allergic rhinitis - Information on allergen immunotherapy presented. - Make an appointment to start allergy shots (once you have your first injection, you can come in whenever you are able to without an appointment).  - We will focus on the pollens only.  - However, this will leave out the outdoor molds (we would have to do TWO injections weekly to include the molds since we cannot mix molds with anything else).  - We could always add the molds in the future if you are still having issues with outdoor exposures after you reach maintenance on the pollens.   2. Oral allergy syndrome - Allergy shots could help with oral allergy syndrome. - Hopefully we will be able to tolerate fruit once you are on maintenance.   3. Return in about 6 months (around 07/28/2022).    Please inform us of any Emergency Department visits, hospitalizations, or changes in symptoms. Call us before going to the ED for breathing or allergy symptoms since we might be able to fit you in for a sick visit. Feel free to contact us anytime with any questions, problems, or concerns.  It was a pleasure to see you again today!  Websites that have reliable patient information: 1. American Academy of Asthma, Allergy, and Immunology: www.aaaai.org 2. Food Allergy Research and Education (FARE): foodallergy.org 3. Mothers of Asthmatics: http://www.asthmacommunitynetwork.org 4. American College of Allergy, Asthma, and Immunology: www.acaai.org   COVID-19 Vaccine Information can be found at: ShippingScam.co.uk For questions related to vaccine distribution or appointments, please email [email or web address] or call [phone].   We realize that you might be concerned about having an allergic reaction to the COVID19 vaccines. To help with that concern, WE ARE OFFERING THE COVID19 VACCINES IN OUR OFFICE! Ask the front desk for dates!     "Like" [place]  on Facebook and Instagram for our latest updates!      A healthy democracy works best when [employer] participate! Make sure you are registered to vote! If you have moved or changed any of your contact information, you will need to get this updated before voting!  In some cases, you MAY be able to register to vote online: [email or web address]

## 2022-07-06 IMAGING — US US PELVIS LIMITED
1 series · 14 of 14 positions shown · non-contrast
Comparison: Ultrasound 10/27/2019.  Abdominopelvic CT 06/02/2019

CLINICAL DATA: Bladder mass.

EXAM:
LIMITED ULTRASOUND OF PELVIS
TECHNIQUE: Limited transabdominal ultrasound examination of the pelvis was
performed.

[Series 1: us pelvis limited (transabdominal only) · 14 acquisitions, 14 frames shown]
[im 1/14]
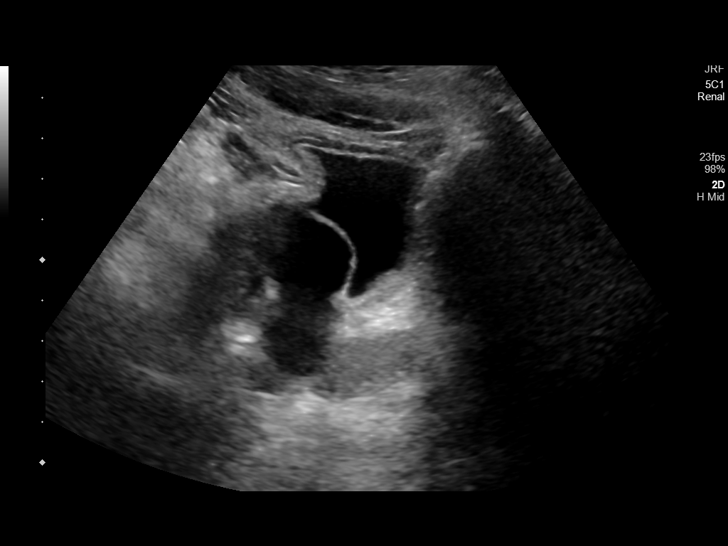
[im 2/14]
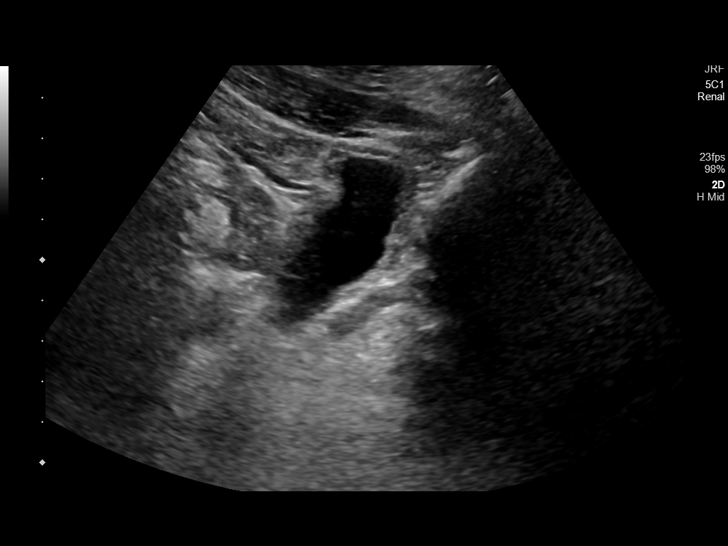
[im 3/14]
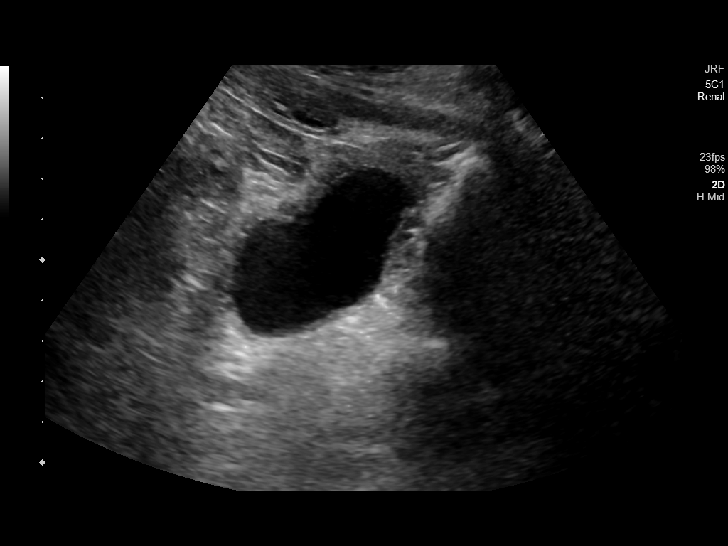
[im 4/14]
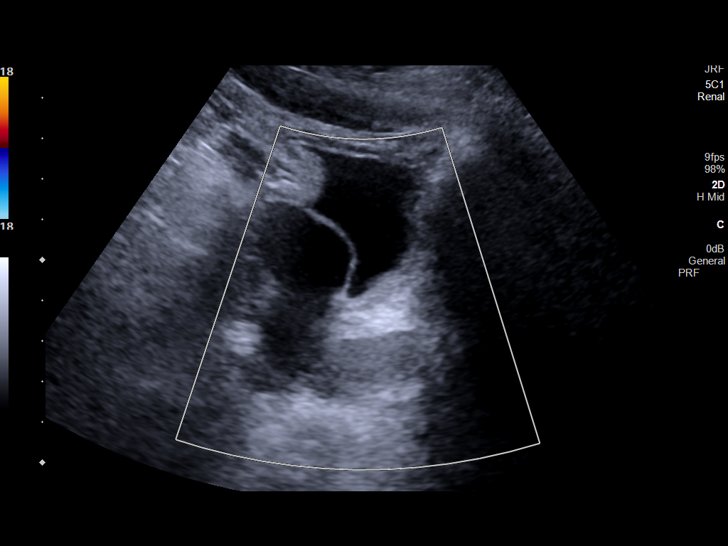
[im 5/14]
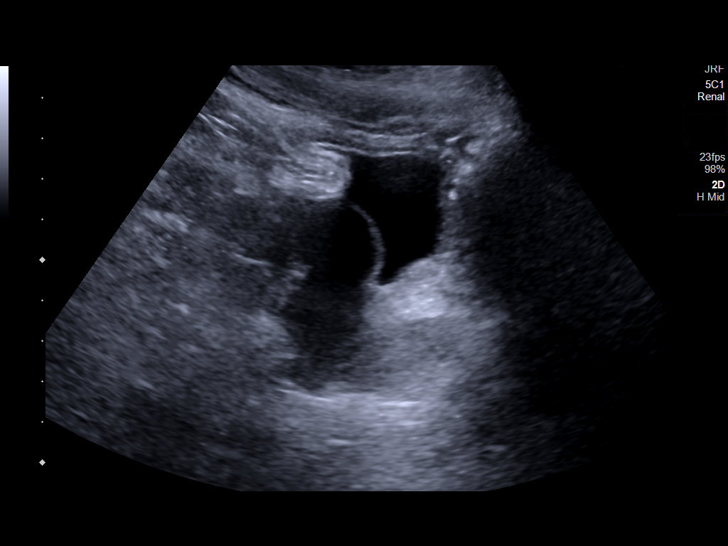
[im 6/14]
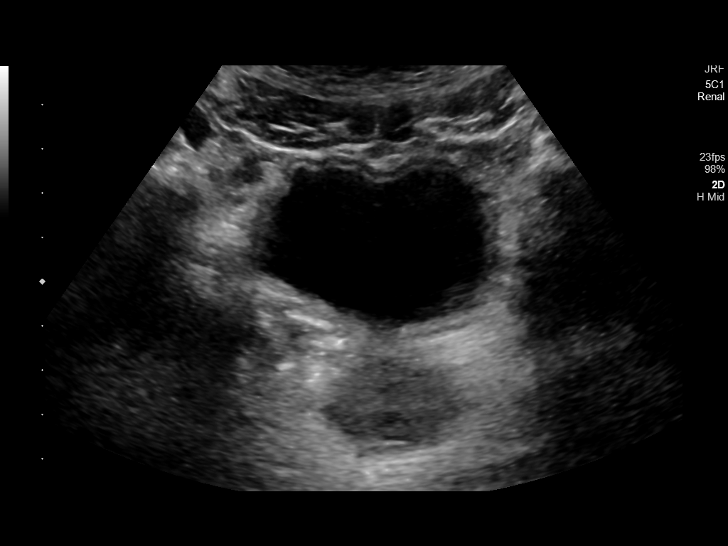
[im 7/14]
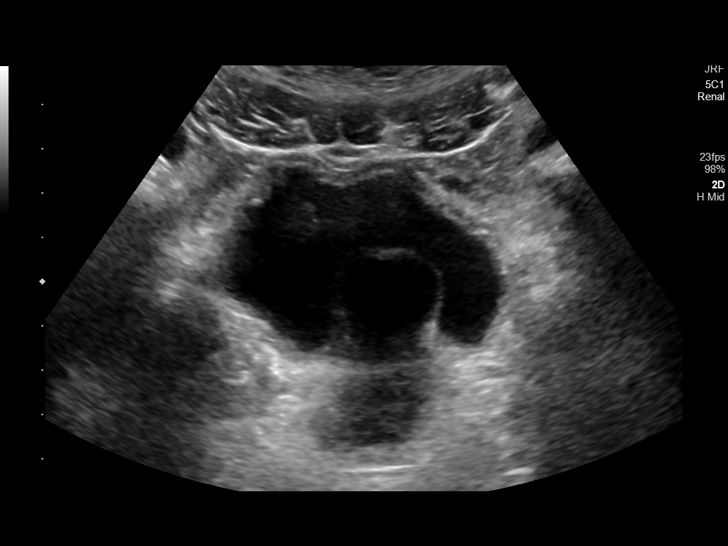
[im 8/14]
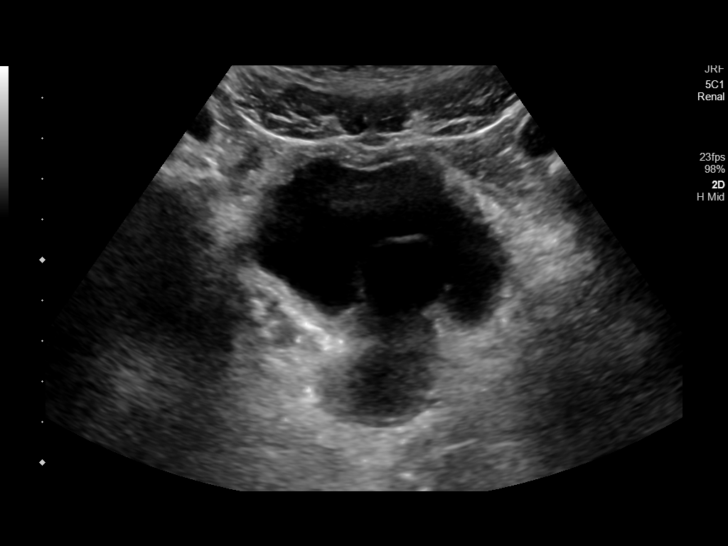
[im 9/14]
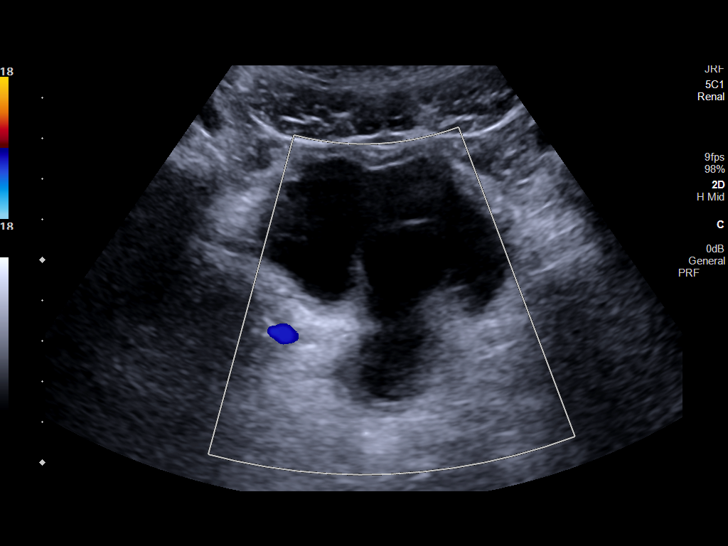
[im 10/14]
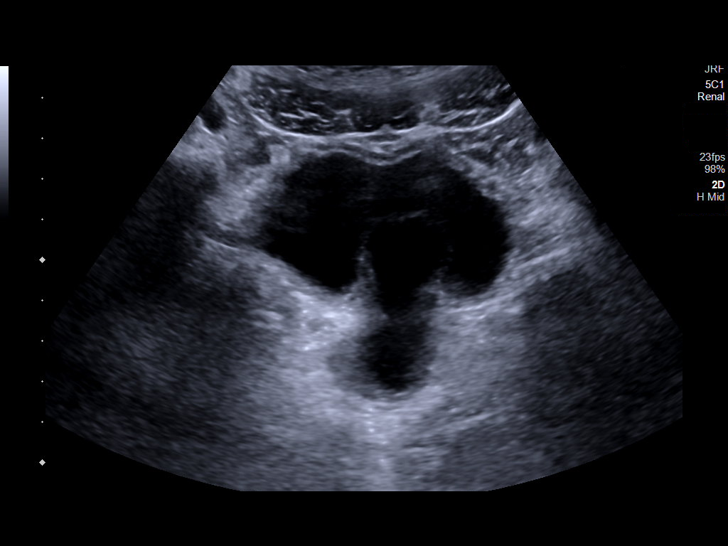
[im 11/14]
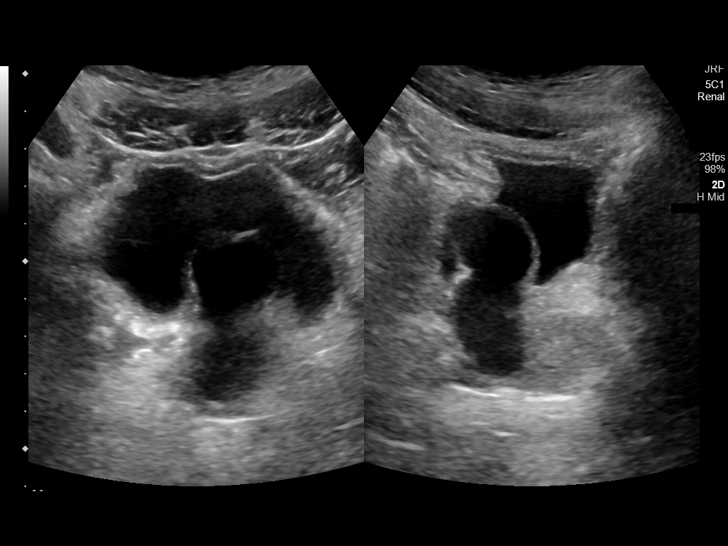
[im 12/14]
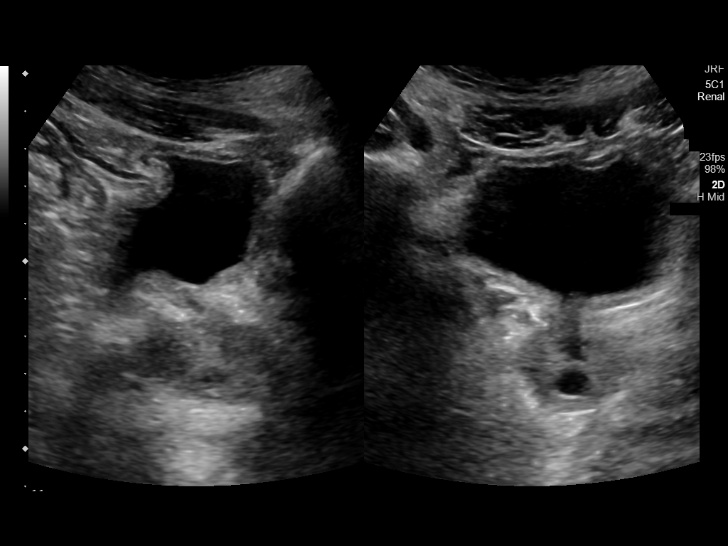
[im 13/14]
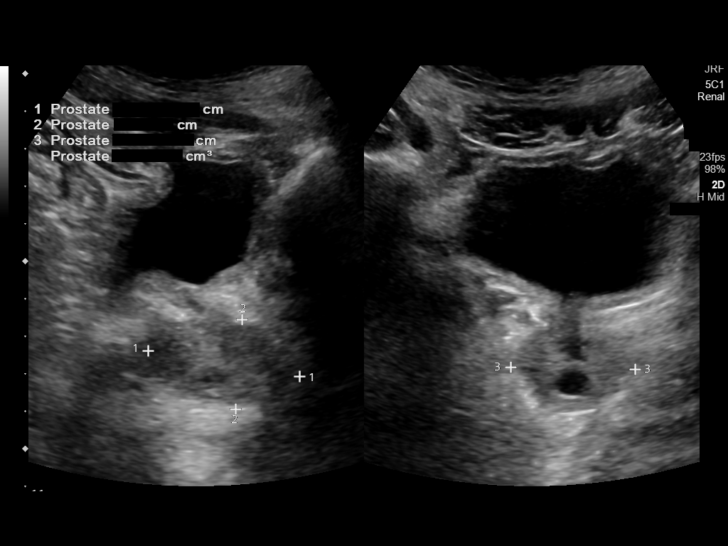
[im 14/14]
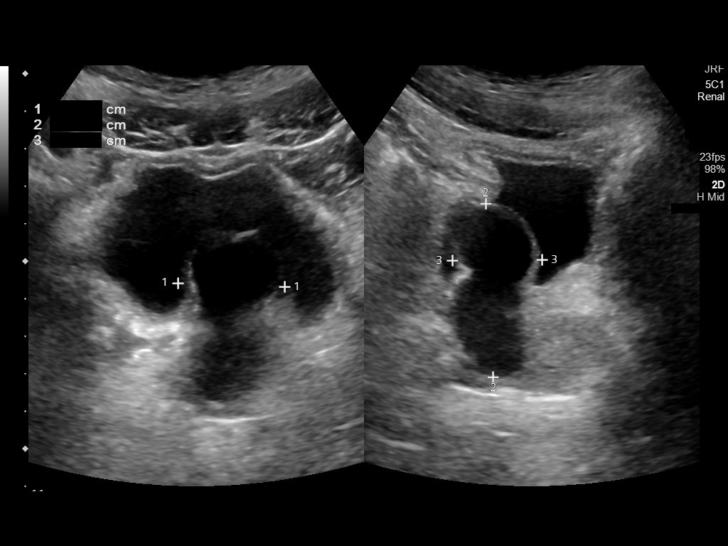

[14 of 14 positions shown; findings below may reference images not displayed]

FINDINGS: Urinary bladder is partially distended at the time of imaging.
Cystic structure at the bladder base just to the left of midline
measures 2.3 x 2.4 x 2.8 cm using same measurement techniques as
prior. There is a questionable hour glass appearance with area
extending beyond the bladder lumen, with cranial caudal measurement
of 4.6 cm. This is not well-defined by ultrasound, but also appears
grossly stable. This is overall not significantly changed in size
from prior. Minimal low level internal echoes, however no definite
mural nodularity. No internal vascularity. Prostate gland measures
4.1 x 2.4 x 3.3 cm, volume of 17 cc, normal.
IMPRESSION: Stable size and appearance of complex cystic intraluminal mass about
the posterior left bladder base, likely representing a ureterocele.

## 2022-07-28 ENCOUNTER — Ambulatory Visit: Payer: BC Managed Care – PPO | Admitting: Allergy & Immunology

## 2022-07-28 DIAGNOSIS — J309 Allergic rhinitis, unspecified: Secondary | ICD-10-CM

## 2022-08-27 ENCOUNTER — Encounter (INDEPENDENT_AMBULATORY_CARE_PROVIDER_SITE_OTHER): Payer: Self-pay

## 2022-09-16 ENCOUNTER — Encounter: Payer: BC Managed Care – PPO | Admitting: Primary Care

## 2024-01-18 ENCOUNTER — Ambulatory Visit (HOSPITAL_COMMUNITY): Admission: EM | Admit: 2024-01-18 | Discharge: 2024-01-18 | Disposition: A | Discharge status: Home / Self Care

## 2024-01-18 ENCOUNTER — Encounter (HOSPITAL_COMMUNITY): Payer: Self-pay

## 2024-01-18 DIAGNOSIS — J111 Influenza due to unidentified influenza virus with other respiratory manifestations: Secondary | ICD-10-CM

## 2024-01-18 NOTE — Discharge Instructions (Signed)
 Return if any problems.

## 2024-01-18 NOTE — ED Provider Notes (Signed)
 " [facility]    CSN: [id number] Arrival date & time: 01/18/24  0801      History   Chief Complaint Chief Complaint  Patient presents with   Nasal Congestion   Sore Throat   Generalized Body Aches   Cough    HPI TEVYN CODD is a 34 y.o. male.   Pt reports he tested positive for influenza b on a home test.  Pt reports he has to have a note for work.  Pt complains of a cough and congestion. No fever or chills.  No shortness of breath  The history is provided by the patient. No language interpreter was used.  Sore Throat This is a new problem. The current episode started 2 days ago. The problem occurs constantly. The problem has been gradually worsening. Pertinent negatives include no shortness of breath. He has tried nothing for the symptoms. The treatment provided no relief.  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 Authorizing Provider  fexofenadine-pseudoephedrine (ALLEGRA-D 24) 180-240 MG 24 hr tablet Take 1 tablet by mouth daily.    [provider]    Family History Family History  Problem Relation Age of Onset   Healthy Mother    Factor V Leiden deficiency Father     Factor V Leiden deficiency Paternal Grandmother    Cancer Neg Hx    Heart failure Neg Hx    Diabetes Neg Hx    Hyperlipidemia Neg Hx    Colon cancer Neg Hx    Colon polyps Neg Hx    Stomach cancer Neg Hx    Esophageal cancer Neg Hx     Social History Social History[1]   Allergies   Other   Review of Systems Review of Systems  Respiratory:  Positive for cough. Negative for shortness of breath.   All other systems reviewed and are negative.    Physical Exam Triage Vital Signs ED Triage Vitals [01/18/24 0819]  Encounter Vitals Group     BP 123/78     Girls Systolic BP Percentile      Girls Diastolic BP Percentile      Boys Systolic BP Percentile      Boys Diastolic BP Percentile      Pulse Rate (!) 50     Resp 16     Temp 98.4 F (36.9 C)     Temp Source Oral     SpO2 97 %     Weight      Height      Head Circumference      Peak Flow  Pain Score 0     Pain Loc      Pain Education      Exclude from Growth Chart    No data found.  Updated Vital Signs BP 123/78 (BP Location: Right Arm)   Pulse (!) 50   Temp 98.4 F (36.9 C) (Oral)   Resp 16   SpO2 97%   Visual Acuity Right Eye Distance:   Left Eye Distance:   Bilateral Distance:    Right Eye Near:   Left Eye Near:    Bilateral Near:     Physical Exam Vitals and nursing note reviewed.  Constitutional:      Appearance: He is well-developed.  HENT:     Head: Normocephalic.  Cardiovascular:     Rate and Rhythm: Normal rate.  Pulmonary:     Effort: Pulmonary effort is normal.  Abdominal:     General: There is no distension.  Musculoskeletal:        General: Normal range of motion.     Cervical back: Normal range of motion.  Skin:    General: Skin is warm.  Neurological:     General: No focal deficit present.     Mental Status: He is alert and oriented to person, place, and time.      UC Treatments / Results  Labs (all labs ordered are listed, but only abnormal results are  displayed) Labs Reviewed - No data to display  EKG   Radiology No results found.  Procedures Procedures (including critical care time)  Medications Ordered in UC Medications - No data to display  Initial Impression / Assessment and Plan / UC Course  I have reviewed the triage vital signs and the nursing notes.  Pertinent labs & imaging results that were available during my care of the patient were reviewed by me and considered in my medical decision making (see chart for details).      Final Clinical Impressions(s) / UC Diagnoses   Final diagnoses:  Influenza with respiratory manifestation     Discharge Instructions      Return if any problems.   ED Prescriptions   None    PDMP not reviewed this encounter. An After Visit Summary was printed and given to the patient.        [1]  Social History Tobacco Use   Smoking status: Never   Smokeless tobacco: Current    Types: Chew  Vaping Use   Vaping status: Former  Substance Use Topics   Alcohol use: Yes    Comment: a few times a week, 1-4 depending on the day   Drug use: No     Flint Sonny POUR, [place] 01/18/24 0933  "

## 2024-01-18 NOTE — ED Triage Notes (Signed)
 Patient c/o nasal congestion, sore throat, body aches, and a productive cough with yellow/green sputum x 2 days.  Patient states he tested positive for Flu B x2. Patient is needing a note for work.

## 2024-02-18 ENCOUNTER — Ambulatory Visit (HOSPITAL_COMMUNITY)
Admission: EM | Admit: 2024-02-18 | Discharge: 2024-02-18 | Disposition: A | Discharge status: Home / Self Care | Source: Home / Self Care

## 2024-02-18 ENCOUNTER — Encounter (HOSPITAL_COMMUNITY): Payer: Self-pay

## 2024-02-18 DIAGNOSIS — K5900 Constipation, unspecified: Secondary | ICD-10-CM

## 2024-02-18 NOTE — ED Triage Notes (Signed)
 Abdominal cramping and constipation onset a few days ago and not resolving. Patient has a history of diverticulosis. No new foods or meds recently. Last BM was small last night.   Patient tried miralax last night with no relief.

## 2024-02-18 NOTE — Discharge Instructions (Signed)
 Follow up with GI Go to ED if symptoms worsen or fail to improve Take Miralax as discussed For one half bottle and 32 ounce of Gatorade drink over the course of next 1 hour.

## 2024-02-18 NOTE — ED Provider Notes (Signed)
 " [facility]    CSN: [id number] Arrival date & time: 02/18/24  1150      History   Chief Complaint Chief Complaint  Patient presents with   Constipation   Abdominal Pain    HPI Daniel Knox is a 34 y.o. male.   Patient is concerned with diverticulitis.  He states he was diagnosed with diverticulosis during colonoscopy due to his previous GI problems about 4 years ago.  He also last 2 days he has had epigastric abdominal pain, constipation.  He denies fever, chills, vomiting, diarrhea, hematochezia, melena.  He denies left lower quadrant abdominal pain.  He reports his last normal bowel movement was 3 days ago.  He took 2 capfuls of MiraLAX last night without relief.    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 Authorizing Provider  fexofenadine-pseudoephedrine (ALLEGRA-D 24) 180-240 MG 24 hr tablet Take 1 tablet by mouth daily.    [provider]    Family History Family History  Problem Relation Age of Onset   Healthy Mother    Factor V Leiden deficiency Father    Factor V Leiden deficiency Paternal Grandmother    Cancer Neg Hx    Heart failure Neg Hx    Diabetes Neg Hx    Hyperlipidemia Neg Hx    Colon cancer Neg Hx    Colon  polyps Neg Hx    Stomach cancer Neg Hx    Esophageal cancer Neg Hx     Social History Social History[1]   Allergies   Other   Review of Systems Review of Systems  Constitutional:  Negative for chills, fatigue and fever.  Gastrointestinal:  Positive for constipation and nausea. Negative for abdominal distention, abdominal pain, blood in stool, diarrhea and vomiting.  Genitourinary:  Negative for dysuria, frequency and urgency.  Musculoskeletal:  Negative for arthralgias and myalgias.     Physical Exam Triage Vital Signs ED Triage Vitals [02/18/24 1229]  Encounter Vitals Group     BP 136/73     Girls Systolic BP Percentile      Girls Diastolic BP Percentile      Boys Systolic BP Percentile      Boys Diastolic BP Percentile      Pulse Rate (!) 52     Resp 17     Temp 98 F (36.7 C)     Temp Source Oral     SpO2 96 %     Weight      Height      Head Circumference      Peak Flow      Pain Score 2  Pain Loc      Pain Education      Exclude from Growth Chart    No data found.  Updated Vital Signs BP 136/73 (BP Location: Right Arm)   Pulse (!) 52   Temp 98 F (36.7 C) (Oral)   Resp 17   SpO2 96%   Visual Acuity Right Eye Distance:   Left Eye Distance:   Bilateral Distance:    Right Eye Near:   Left Eye Near:    Bilateral Near:     Physical Exam Vitals and nursing note reviewed.  Constitutional:      General: He is not in acute distress.    Appearance: Normal appearance. He is not ill-appearing.  HENT:     Head: Normocephalic and atraumatic.  Eyes:     General: No scleral icterus.    Extraocular Movements: Extraocular movements intact.     Conjunctiva/sclera: Conjunctivae normal.  Pulmonary:     Effort: Pulmonary effort is normal. No respiratory distress.  Abdominal:     General: Abdomen is flat. Bowel sounds are normal.     Tenderness: There is no abdominal tenderness. There is no right CVA tenderness, left CVA tenderness, guarding or  rebound. Negative signs include Murphy's sign, McBurney's sign and obturator sign.  Musculoskeletal:        General: Normal range of motion.     Cervical back: Normal range of motion. No rigidity.  Skin:    General: Skin is warm.     Coloration: Skin is not jaundiced.     Findings: No rash.  Neurological:     General: No focal deficit present.     Mental Status: He is alert and oriented to person, place, and time.     Motor: No weakness.     Gait: Gait normal.  Psychiatric:        Mood and Affect: Mood normal.        Behavior: Behavior normal.      UC Treatments / Results  Labs (all labs ordered are listed, but only abnormal results are displayed) Labs Reviewed - No data to display  EKG   Radiology No results found.  Procedures Procedures (including critical care time)  Medications Ordered in UC Medications - No data to display  Initial Impression / Assessment and Plan / UC Course  I have reviewed the triage vital signs and the nursing notes.  Pertinent labs & imaging results that were available during my care of the patient were reviewed by me and considered in my medical decision making (see chart for details).     Discussed KUB with patient, patient declines. I discussed signs and symptoms of diverticulitis with patient including diarrhea/constipation, blood in stool, left lower quadrant abdominal pain. I feel this is likely to be constipation, and not diverticulitis however I advised him that he will need to go to the emergency room for CT if he is concerned about diverticulitis, patient declines at this time.  He would like to go home and try additional MiraLAX to see if that will resolve his symptoms. Follow-up with GI, ED precautions provided. Patient expressed understanding Final Clinical Impressions(s) / UC Diagnoses   Final diagnoses:  Constipation, unspecified constipation type     Discharge Instructions      Follow up with GI Go to ED if  symptoms worsen or fail to improve Take Miralax as discussed For one half bottle and 32 ounce of Gatorade drink over the course of next 1 hour.  ED Prescriptions   None    PDMP not reviewed this encounter.    [1]  Social History Tobacco Use   Smoking status: Never   Smokeless tobacco: Current    Types: Chew  Vaping Use   Vaping status: Former  Substance Use Topics   Alcohol use: Yes    Comment: a few times a week, 1-4 depending on the day   Drug use: No     Juleen Rush, PA-C 02/18/24 1325  "
